# Patient Record
Sex: Female | Born: 1945 | Race: White | Hispanic: No | Smoking: Never smoker
Health system: Southern US, Community
[De-identification: ages and names within clinical notes are randomized; demographics above are authoritative.]

## PROBLEM LIST (undated history)

## (undated) DIAGNOSIS — I10 Essential (primary) hypertension: Secondary | ICD-10-CM

## (undated) DIAGNOSIS — R3915 Urgency of urination: Secondary | ICD-10-CM

## (undated) DIAGNOSIS — M1990 Unspecified osteoarthritis, unspecified site: Secondary | ICD-10-CM

## (undated) DIAGNOSIS — J302 Other seasonal allergic rhinitis: Secondary | ICD-10-CM

## (undated) DIAGNOSIS — R351 Nocturia: Secondary | ICD-10-CM

## (undated) DIAGNOSIS — N3030 Trigonitis without hematuria: Secondary | ICD-10-CM

## (undated) DIAGNOSIS — F419 Anxiety disorder, unspecified: Secondary | ICD-10-CM

## (undated) DIAGNOSIS — N362 Urethral caruncle: Secondary | ICD-10-CM

## (undated) DIAGNOSIS — N952 Postmenopausal atrophic vaginitis: Secondary | ICD-10-CM

## (undated) DIAGNOSIS — E785 Hyperlipidemia, unspecified: Secondary | ICD-10-CM

## (undated) DIAGNOSIS — G932 Benign intracranial hypertension: Secondary | ICD-10-CM

## (undated) DIAGNOSIS — K7460 Unspecified cirrhosis of liver: Secondary | ICD-10-CM

## (undated) DIAGNOSIS — N393 Stress incontinence (female) (male): Secondary | ICD-10-CM

## (undated) DIAGNOSIS — E663 Overweight: Secondary | ICD-10-CM

## (undated) DIAGNOSIS — R0982 Postnasal drip: Secondary | ICD-10-CM

## (undated) DIAGNOSIS — N210 Calculus in bladder: Secondary | ICD-10-CM

## (undated) DIAGNOSIS — N35919 Unspecified urethral stricture, male, unspecified site: Secondary | ICD-10-CM

## (undated) DIAGNOSIS — IMO0002 Reserved for concepts with insufficient information to code with codable children: Secondary | ICD-10-CM

## (undated) DIAGNOSIS — N3020 Other chronic cystitis without hematuria: Secondary | ICD-10-CM

## (undated) DIAGNOSIS — N8110 Cystocele, unspecified: Secondary | ICD-10-CM

## (undated) DIAGNOSIS — K219 Gastro-esophageal reflux disease without esophagitis: Secondary | ICD-10-CM

## (undated) DIAGNOSIS — N889 Noninflammatory disorder of cervix uteri, unspecified: Secondary | ICD-10-CM

## (undated) DIAGNOSIS — K769 Liver disease, unspecified: Secondary | ICD-10-CM

## (undated) DIAGNOSIS — I639 Cerebral infarction, unspecified: Secondary | ICD-10-CM

## (undated) DIAGNOSIS — Z8673 Personal history of transient ischemic attack (TIA), and cerebral infarction without residual deficits: Secondary | ICD-10-CM

## (undated) HISTORY — DX: Personal history of transient ischemic attack (TIA), and cerebral infarction without residual deficits: Z86.73

## (undated) HISTORY — DX: Postmenopausal atrophic vaginitis: N95.2

## (undated) HISTORY — DX: Trigonitis without hematuria: N30.30

## (undated) HISTORY — DX: Urgency of urination: R39.15

## (undated) HISTORY — PX: TONSILLECTOMY: SUR1361

## (undated) HISTORY — DX: Reserved for concepts with insufficient information to code with codable children: IMO0002

## (undated) HISTORY — DX: Essential (primary) hypertension: I10

## (undated) HISTORY — DX: Nocturia: R35.1

## (undated) HISTORY — DX: Overweight: E66.3

## (undated) HISTORY — DX: Liver disease, unspecified: K76.9

## (undated) HISTORY — DX: Hyperlipidemia, unspecified: E78.5

## (undated) HISTORY — DX: Unspecified urethral stricture, male, unspecified site: N35.919

## (undated) HISTORY — DX: Calculus in bladder: N21.0

## (undated) HISTORY — DX: Unspecified osteoarthritis, unspecified site: M19.90

## (undated) HISTORY — DX: Gastro-esophageal reflux disease without esophagitis: K21.9

## (undated) HISTORY — DX: Unspecified cirrhosis of liver: K74.60

## (undated) HISTORY — DX: Other chronic cystitis without hematuria: N30.20

## (undated) HISTORY — DX: Noninflammatory disorder of cervix uteri, unspecified: N88.9

## (undated) HISTORY — DX: Benign intracranial hypertension: G93.2

## (undated) HISTORY — PX: GASTRIC BYPASS: SHX52

## (undated) HISTORY — DX: Urethral caruncle: N36.2

## (undated) HISTORY — DX: Anxiety disorder, unspecified: F41.9

---
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
## 1974-11-25 HISTORY — PX: VAGINAL HYSTERECTOMY: SUR661

## 1997-11-25 HISTORY — PX: BLADDER SURGERY: SHX569

## 2005-03-19 ENCOUNTER — Ambulatory Visit: Payer: Self-pay | Admitting: Family Medicine

## 2005-05-03 ENCOUNTER — Ambulatory Visit: Payer: Self-pay | Admitting: Gastroenterology

## 2005-06-03 ENCOUNTER — Ambulatory Visit: Payer: Self-pay | Admitting: Family Medicine

## 2005-09-26 ENCOUNTER — Ambulatory Visit: Payer: Self-pay | Admitting: Family Medicine

## 2006-03-29 ENCOUNTER — Emergency Department: Payer: Self-pay | Admitting: Emergency Medicine

## 2006-05-07 ENCOUNTER — Ambulatory Visit: Payer: Self-pay | Admitting: Family Medicine

## 2008-09-06 ENCOUNTER — Ambulatory Visit: Payer: Self-pay | Admitting: Unknown Physician Specialty

## 2008-12-22 ENCOUNTER — Ambulatory Visit: Payer: Self-pay | Admitting: Family Medicine

## 2009-10-08 ENCOUNTER — Emergency Department: Payer: Self-pay | Admitting: Emergency Medicine

## 2010-02-13 ENCOUNTER — Ambulatory Visit: Payer: Self-pay | Admitting: Family Medicine

## 2011-02-15 ENCOUNTER — Ambulatory Visit: Payer: Self-pay | Admitting: Gastroenterology

## 2011-06-10 ENCOUNTER — Ambulatory Visit: Payer: Self-pay | Admitting: Family Medicine

## 2012-08-31 ENCOUNTER — Ambulatory Visit: Payer: Self-pay | Admitting: Family Medicine

## 2012-09-07 ENCOUNTER — Emergency Department: Payer: Self-pay | Admitting: Emergency Medicine

## 2012-09-07 ENCOUNTER — Ambulatory Visit: Payer: Self-pay | Admitting: Family Medicine

## 2012-09-07 LAB — CREATININE, SERUM: Creatinine: 1.05 mg/dL (ref 0.60–1.30)

## 2012-10-13 ENCOUNTER — Ambulatory Visit: Payer: Self-pay | Admitting: Family Medicine

## 2013-10-14 ENCOUNTER — Ambulatory Visit: Payer: Self-pay | Admitting: Family Medicine

## 2014-01-07 ENCOUNTER — Ambulatory Visit: Payer: Self-pay | Admitting: Gastroenterology

## 2014-08-31 ENCOUNTER — Ambulatory Visit: Payer: Self-pay | Admitting: Family Medicine

## 2014-12-26 HISTORY — PX: OTHER SURGICAL HISTORY: SHX169

## 2015-01-05 ENCOUNTER — Ambulatory Visit: Payer: Self-pay | Admitting: Orthopedic Surgery

## 2015-01-16 ENCOUNTER — Ambulatory Visit: Payer: Self-pay | Admitting: Anesthesiology

## 2015-01-16 DIAGNOSIS — I10 Essential (primary) hypertension: Secondary | ICD-10-CM

## 2015-01-19 ENCOUNTER — Ambulatory Visit: Payer: Self-pay | Admitting: Orthopedic Surgery

## 2015-03-26 NOTE — Op Note (Signed)
PATIENT NAME:  Abigail Horne, Abigail Horne MR#:  [id number] DATE OF BIRTH:  1946/02/21  DATE OF PROCEDURE:  01/19/2015  PREOPERATIVE DIAGNOSIS: Medial meniscus tear and arthritis, left knee.   POSTOPERATIVE DIAGNOSIS: Medial meniscus tear and arthritis, left knee.  PROCEDURE: Left knee arthroscopy.   ANESTHESIA: General.   SURGEON: Laurene Footman, MD   DESCRIPTION OF PROCEDURE: The patient was brought to the operating room and after adequate anesthesia was obtained, the left leg was prepped and draped in the usual sterile fashion with the leg in the arthroscopic leg holder with a tourniquet applied but not required. After patient identification and timeout procedures were completed, an inferolateral portal was made and the arthroscope was introduced. Initial inspection revealed mild to moderate patellofemoral degenerative change in both trochlea and patella with many small fragments of cartilage floating in the joint. The gutters were checked and there were no loose bodies. Coming around medially, an inferomedial portal was made and, on probing, there was a complex tear of the middle and posterior thirds of the meniscus with a large flap tear that was flipped under the medial middle third. There was also associated 1 x 2 cm area of complete cartilage loss with exposed bone. The remaining in the medial compartment was relatively spared. The ACL was intact and the lateral compartment was normal. A meniscal punch arthroscopic shaver and ArthroCare wand were all used to debride the medial meniscus back to a stable margin, as well as thoroughly irrigating out the knee and removing the loose articular cartilage from the medial femoral defect. After addressing the meniscal pathology and irrigating out the knee, all instrumentation was withdrawn. The wounds were closed in simple interrupted 4-0 nylon; 20 mL of 0.5% Sensorcaine infiltrated for postoperative analgesia. Xeroform, 4 x 4's, Webril, and Ace wrap applied. The  patient was sent to the recovery room in stable condition.   ESTIMATED BLOOD LOSS: Minimal.   COMPLICATIONS: None.   SPECIMEN: None. Pre- and postprocedure pictures obtained.    ____________________________ Laurene Footman, MD mjm:bm D: 01/19/2015 19:48:49 ET T: 01/20/2015 07:08:59 ET JOB#: [id number]  cc: Laurene Footman, MD, <Dictator> Laurene Footman MD ELECTRONICALLY SIGNED 01/20/2015 8:31

## 2015-05-02 ENCOUNTER — Encounter: Payer: Self-pay | Admitting: *Deleted

## 2015-05-02 ENCOUNTER — Encounter: Payer: Self-pay | Admitting: Urology

## 2015-05-03 ENCOUNTER — Telehealth: Payer: Self-pay | Admitting: Urology

## 2015-05-03 ENCOUNTER — Encounter: Payer: Self-pay | Admitting: Urology

## 2015-05-03 ENCOUNTER — Ambulatory Visit (INDEPENDENT_AMBULATORY_CARE_PROVIDER_SITE_OTHER): Payer: PPO | Admitting: Urology

## 2015-05-03 VITALS — BP 168/78 | HR 61 | Ht 63.0 in | Wt 242.7 lb

## 2015-05-03 DIAGNOSIS — R399 Unspecified symptoms and signs involving the genitourinary system: Secondary | ICD-10-CM | POA: Diagnosis not present

## 2015-05-03 DIAGNOSIS — N8110 Cystocele, unspecified: Secondary | ICD-10-CM

## 2015-05-03 DIAGNOSIS — IMO0001 Reserved for inherently not codable concepts without codable children: Secondary | ICD-10-CM

## 2015-05-03 DIAGNOSIS — IMO0002 Reserved for concepts with insufficient information to code with codable children: Secondary | ICD-10-CM

## 2015-05-03 DIAGNOSIS — N952 Postmenopausal atrophic vaginitis: Secondary | ICD-10-CM

## 2015-05-03 LAB — URINALYSIS, COMPLETE
Bilirubin, UA: NEGATIVE
GLUCOSE, UA: NEGATIVE
Ketones, UA: NEGATIVE
Nitrite, UA: POSITIVE — AB
PH UA: 5.5 (ref 5.0–7.5)
PROTEIN UA: NEGATIVE
RBC, UA: NEGATIVE
SPEC GRAV UA: 1.025 (ref 1.005–1.030)
UUROB: 0.2 mg/dL (ref 0.2–1.0)

## 2015-05-03 LAB — MICROSCOPIC EXAMINATION

## 2015-05-03 LAB — BLADDER SCAN AMB NON-IMAGING: Scan Result: 30

## 2015-05-03 NOTE — Telephone Encounter (Signed)
Called in medication to [facility] for 0.075%. Pharmacy will notify pt when medication is ready. Cw,lpn

## 2015-05-03 NOTE — Progress Notes (Signed)
05/03/2015 2:27 PM   [name] 08-21-1946 [id number]  Referring provider: No referring provider defined for this encounter.  Chief Complaint  Patient presents with  . Urinary Tract Infection    burning, itching    HPI: Abigail Horne is a 69 year old white female who initially presented to [place] 3 years ago for recurrent urinary tract infections at the request of her primary care physician. She was found to have atrophic vaginitis and a cystocele.  She was placed on vaginal estrogen cream and her urinary tract infections have reduced in their frequency.  We last saw her in February 2016 for routine office visit. She was scheduled to return to our office in a year, but recently had a urinary tract infection which took  2 courses of Cipro to clear her of her symptoms. Her symptoms at the time her infections were lower leg pain, perineal pain, suprapubic pain and dysuria. She denies any fevers, chills, nausea and vomiting at that time.  She denies any gross hematuria. Today, she has no symptoms.  She wanted to follow up with [place] to make sure everything was okay.  She has not been using her vaginal Estrace cream routinely over the last few months due to her husband being in and out of the hospital and caring for him.  Her PVR today was minimal and her UA had 6-10 WBC's/hpf with many bacteria today.  It was a cath specimen.  She is not experiencing any leakage.   Her two UCx's are listed below:    Urine Culture, Routine - [facility]4/18/2016  [facility]  Component Name Value Range  Urine Culture, Routine - [facility] Final report (A)   Result 1 - [facility] Comment (A) Comment: Enterobacter cloacae complex Greater than 100,000 colony forming units per mL   Antimicrobial Susceptibility - [facility] Comment Comment:       ** S = Susceptible; I = Intermediate; R = Resistant **                    P = Positive; N = Negative             MICS are expressed in micrograms per mL  Antibiotic                 RSLT#1    RSLT#2    RSLT#3    RSLT#4 Amoxicillin/Clavulanic Acid    R Cefazolin                      R Cefepime                       S Ceftriaxone                    S Cefuroxime                     R Cephalothin                    R Ciprofloxacin                  S Ertapenem                      S Gentamicin                     S Imipenem  S Levofloxacin                   S Nitrofurantoin                 I Piperacillin                   S Tetracycline                   S Tobramycin                     S Trimethoprim/Sulfa             S    Specimen  Urine   Result Narrative  Performed at:  [facility] [address], [place], [place]  [id number] Lab Director: Lindon Romp MD, Phone:  [phone]   Urine Culture, Routine - [facility]5/03/2015  [facility]  Component Name Value Range  Urine Culture, Routine - [facility] Final report   Result 1 - [facility] Comment Comment: Culture shows less than 10,000 colony forming units of bacteria per milliliter of urine. This colony count is not generally considered to be clinically significant.    Specimen  Urine   Result Narrative  Performed at:  [address] [place], [place]  [id number] Lab Director: Lindon Romp MD, Phone:  [phone]      PMH: Past Medical History  Diagnosis Date  . Liver disease   . Hx-TIA (transient ischemic attack)   . BIH (benign intracranial hypertension)   . Hyperlipidemia   . Cervix abnormality   . Arthritis   . Hypertension   . Cirrhosis   . Cystocele   . Nocturia   . Over weight   . Atrophic vaginitis   . Urethral caruncle   . Chronic cystitis   . Urinary urgency   . Bladder calculus   . Trigonitis   . Urethral stricture     Surgical History: Past Surgical History  Procedure Laterality Date  . Bladder surgery  1999    tack  . Abdominal hysterectomy  1976  . Appendectomy   1955  . Gastric bypass      lap band  . Arthroscopic knee surgery Left 12/2014    meniscus    Home Medications:    Medication List       This list is accurate as of: 05/03/15 11:59 PM.  Always use your most recent med list.               amLODipine 5 MG tablet  Commonly known as:  NORVASC  Take 5 mg by mouth daily.     estradiol 0.1 MG/GM vaginal cream  Commonly known as:  ESTRACE  Place 1 Applicatorful vaginally at bedtime.     etodolac 500 MG tablet  Commonly known as:  LODINE     lisinopril-hydrochlorothiazide 20-25 MG per tablet  Commonly known as:  PRINZIDE,ZESTORETIC  Take 1 tablet by mouth daily.     metoprolol succinate 50 MG 24 hr tablet  Commonly known as:  TOPROL-XL  Take 50 mg by mouth daily. Take with or immediately following a meal.     naproxen sodium 220 MG tablet  Commonly known as:  ANAPROX  Take 220 mg by mouth as needed.     omeprazole 20 MG capsule  Commonly known as:  PRILOSEC  Take 20 mg by mouth daily.     PRESERVISION/LUTEIN PO  Take by mouth.     sertraline 100 MG tablet  Commonly known as:  ZOLOFT  Take by mouth.     TYLENOL ALLERGY MULTI-SYMPTOM PO  Take by mouth.        Allergies:  Allergies  Allergen Reactions  . Contrast Media [Iodinated Diagnostic Agents] Other (See Comments)    irregular heartbeat.    Family History: Family History  Problem Relation Age of Onset  . Heart attack Father   . Alzheimer's disease Mother   . Kidney disease Neg Hx     Social History:  reports that she has never smoked. She does not have any smokeless tobacco history on file. She reports that she does not drink alcohol or use illicit drugs.  ROS: Urological Symptom Review  Patient is experiencing the following symptoms: Get up at night to urinate Urinary tract infection   Review of Systems  Gastrointestinal (upper)  : Negative for upper GI symptoms  Gastrointestinal (lower) : Negative for lower GI  symptoms  Constitutional : Negative for symptoms  Skin: Negative for skin symptoms  Eyes: Negative for eye symptoms  Ear/Nose/Throat : Negative for Ear/Nose/Throat symptoms  Hematologic/Lymphatic: Negative for Hematologic/Lymphatic symptoms  Cardiovascular : Negative for cardiovascular symptoms  Respiratory : Negative for respiratory symptoms  Endocrine: Negative for endocrine symptoms  Musculoskeletal: Negative for musculoskeletal symptoms  Neurological: Negative for neurological symptoms  Psychologic: Negative for psychiatric symptoms   Physical Exam: BP 168/78 mmHg  Pulse 61  Ht 5\' 3"  (1.6 m)  Wt 242 lb 11.2 oz (110.088 kg)  BMI 43.00 kg/m2  LMP   GU: No tenderness, inflammation, rashes or lesions of external genitalia, urethral meatus with caruncle, no discharge (Urethral hypermobility without demonstrable SUI), urethra normal, bladder non-tender, no palpable masses (Fairly good vaginal vault support, no evidence of apical descent, Grade II cystocele and rectocele with Valsalva), normal vaginal walls with atrophic mucosa, no lesions, normal cervix, no motion tenderness or discharge, adnexa could not be palpated due to body habitus Laboratory Data: No results found for: WBC, HGB, HCT, MCV, PLT  Lab Results  Component Value Date   CREATININE 1.05 09/07/2012    No results found for: PSA  No results found for: TESTOSTERONE  No results found for: HGBA1C  Urinalysis Results for orders placed or performed in visit on 05/03/15  CULTURE, URINE COMPREHENSIVE  Result Value Ref Range   Urine Culture, Comprehensive Final report (A)    Result 1 Klebsiella pneumoniae (A)    ANTIMICROBIAL SUSCEPTIBILITY Comment   Microscopic Examination  Result Value Ref Range   WBC, UA 6-10 (A) 0 -  5 /hpf   RBC, UA 0-2 0 -  2 /hpf   Epithelial Cells (non renal) 0-10 0 - 10 /hpf   Bacteria, UA Many (A) None seen/Few  Urinalysis, Complete  Result Value Ref Range   Specific  Gravity, UA 1.025 1.005 - 1.030   pH, UA 5.5 5.0 - 7.5   Color, UA Yellow Yellow   Appearance Ur Hazy (A) Clear   Leukocytes, UA 1+ (A) Negative   Protein, UA Negative Negative/Trace   Glucose, UA Negative Negative   Ketones, UA Negative Negative   RBC, UA Negative Negative   Bilirubin, UA Negative Negative   Urobilinogen, Ur 0.2 0.2 - 1.0 mg/dL   Nitrite, UA Positive (A) Negative   Microscopic Examination See below:   Bladder Scan (Post Void Residual) in office  Result Value Ref Range   Scan Result 30  Pertinent Imaging: Results for Abigail, Horne (MRN [id number]) as of 05/03/2015 10:19  Ref. Range 05/03/2015 09:26  Scan Result Unknown 30    Procedure:  In and Out Catheterization  Patient is present today for a I & O catheterization due to current UTI's. Patient was cleaned and prepped in a sterile fashion with betadine.  A 16FR cath was inserted no complications were noted , 35 ml of urine return was noted, urine was yellow, clear in color. A clean urine sample was collected for culture. Bladder was drained  And catheter was removed with out difficulty.    Preformed by: Zara Council P.A.-C    Assessment & Plan:    1. UTI symptoms- Patient's symptoms of lower leg pain, perineal pain, suprapubic pain and dysuria have resolved. Her urine culture was positive for Enterobacter which was sensitive to the Cipro she was prescribed. Her symptoms still persisted even though the repeat culture was negative for infection. She did respond to a 2 week course of ciprofloxacin. Today she did have 6-10 WBC's per high-power field and many bacteria in her catheter specimen. We will send that for culture to ensure resolution of the urinary tract infection.  If there is infection, we will treat with appropriate antibiotics. Then return in 3-5 days for a another catheter specimen to ensure resolution of the infection. If culture returns negative for infection, she will follow up in 1 year. She  is encouraged to use the vaginal estrogen cream 3 nights weekly. I also reiterated this to her as an important step in preventing further urinary tract infections.  - Urinalysis, Complete - CULTURE, URINE COMPREHENSIVE - Bladder Scan (Post Void Residual) in office  2. Atrophic vaginitis- Patient is not using her vaginal estrogen cream on a consistent basis due to her husband's illnesses.  I explained to the patient that this is an important step in preventing urinary tract infections and to use the vaginal Estrace cream on a consistent basis 3 nights weekly.  She will return in 1 year for examination.  3. Cystocele- Patient wanted to know what she should do about her cystocele. We discussed how her weight is contributing her pelvic prolapsing. We discussed physical therapy and/or pessary fitting for treatment of her cystocele.  She did not want to pursue either of those options and she is not a surgical candidate at this time due to her high BMI.   She is not experiencing any stress urinary incontinence, so surgery is not indicated at this time.    No Follow-up on file. Zara Council, [facility] [address] [place], [place] [address] [phone]

## 2015-05-03 NOTE — Telephone Encounter (Signed)
Please call in the compounded estrogen cream to [facility].

## 2015-05-05 LAB — CULTURE, URINE COMPREHENSIVE

## 2015-05-06 DIAGNOSIS — R399 Unspecified symptoms and signs involving the genitourinary system: Secondary | ICD-10-CM | POA: Insufficient documentation

## 2015-05-06 DIAGNOSIS — N952 Postmenopausal atrophic vaginitis: Secondary | ICD-10-CM | POA: Insufficient documentation

## 2015-05-06 DIAGNOSIS — N8110 Cystocele, unspecified: Secondary | ICD-10-CM | POA: Insufficient documentation

## 2015-05-08 ENCOUNTER — Telehealth: Payer: Self-pay

## 2015-05-08 DIAGNOSIS — N390 Urinary tract infection, site not specified: Secondary | ICD-10-CM

## 2015-05-08 MED ORDER — AMOXICILLIN-POT CLAVULANATE 875-125 MG PO TABS: 1.0000 | ORAL_TABLET | Freq: Two times a day (BID) | ORAL | Status: DC

## 2015-05-08 NOTE — Telephone Encounter (Signed)
-----   Message from Nori Riis, PA-C sent at 05/05/2015  4:58 PM EDT ----- Patient has a +UCx.  She needs to start Augmentin 875/125 mg one tablet twice daily for seven days.  We then need a cath specimen 3 to 5 days after she finishes her antibiotics.

## 2015-05-09 NOTE — Telephone Encounter (Signed)
Spoke with pt and made aware of +urine cx. Pt made aware of needing cath specimen post abt. Pt voiced understanding. Medication called into pharmacy. Cw,lpn

## 2015-05-17 DIAGNOSIS — K219 Gastro-esophageal reflux disease without esophagitis: Secondary | ICD-10-CM | POA: Insufficient documentation

## 2015-05-17 DIAGNOSIS — I10 Essential (primary) hypertension: Secondary | ICD-10-CM | POA: Insufficient documentation

## 2015-05-17 DIAGNOSIS — F419 Anxiety disorder, unspecified: Secondary | ICD-10-CM | POA: Insufficient documentation

## 2015-05-17 DIAGNOSIS — E668 Other obesity: Secondary | ICD-10-CM | POA: Insufficient documentation

## 2015-06-26 ENCOUNTER — Ambulatory Visit (INDEPENDENT_AMBULATORY_CARE_PROVIDER_SITE_OTHER): Payer: PPO | Admitting: Urology

## 2015-06-26 ENCOUNTER — Encounter: Payer: Self-pay | Admitting: Urology

## 2015-06-26 VITALS — BP 123/75 | HR 56 | Ht 62.0 in | Wt 239.5 lb

## 2015-06-26 DIAGNOSIS — N8111 Cystocele, midline: Secondary | ICD-10-CM

## 2015-06-26 DIAGNOSIS — N390 Urinary tract infection, site not specified: Secondary | ICD-10-CM | POA: Insufficient documentation

## 2015-06-26 LAB — URINALYSIS, COMPLETE
Bilirubin, UA: NEGATIVE
Glucose, UA: NEGATIVE
Ketones, UA: NEGATIVE
Nitrite, UA: POSITIVE — AB
PH UA: 6.5 (ref 5.0–7.5)
PROTEIN UA: NEGATIVE
SPEC GRAV UA: 1.02 (ref 1.005–1.030)
Urobilinogen, Ur: 1 mg/dL (ref 0.2–1.0)

## 2015-06-26 LAB — MICROSCOPIC EXAMINATION
Epithelial Cells (non renal): NONE SEEN /hpf (ref 0–10)
RBC, UA: NONE SEEN /hpf (ref 0–?)

## 2015-06-26 NOTE — Progress Notes (Signed)
In and Out Catheterization  Patient is present today for a I & O catheterization due to recurrent UTI. Patient was cleaned and prepped in a sterile fashion with betadine and Lidocaine 2% jelly was instilled into the urethra.  A 14 FR cath was inserted no complications were noted , 80 ml of urine return was noted, urine was light yellow  in color. A clean urine sample was collected for recurrent UTI.  Bladder was drained and catheter was removed with out difficulty.    Preformed by: K.Russell, CMA  Follow up/ Additional notes: Urine was sent for culture

## 2015-06-26 NOTE — Progress Notes (Addendum)
06/26/2015 3:40 PM   Jeronimo Greaves [place] 24-May-1946 [id number]  Referring provider: [place] Pink, MD [address] [place], [place] [address]  Chief Complaint  Patient presents with  . Follow-up    recheck uti    HPI: Mrs. Plantz is a 69 year old white female with a history of positive urine culture with Klebsiella pneumonia 8. He was sensitive to Augmentin. She did take a 7 day course of this medication, but she failed to follow-up for a catheter specimen 3-5 days after completing her antibiotic due to her husband passing away.  Today, she complains of malodorous urine. She states it has a strong sulfur smell to it. She is also experiencing an increase in urinary frequency.     She is denied any fevers, chills, nausea or vomiting. She also denies any gross hematuria.   Her cath urinalysis today is nitrite positive with 11-30 WBC's and many bacteria per high-power field  PMH: Past Medical History  Diagnosis Date  . Liver disease   . Hx-TIA (transient ischemic attack)   . BIH (benign intracranial hypertension)   . Hyperlipidemia   . Cervix abnormality   . Arthritis   . Hypertension   . Cirrhosis   . Cystocele   . Nocturia   . Over weight   . Atrophic vaginitis   . Urethral caruncle   . Chronic cystitis   . Urinary urgency   . Bladder calculus   . Trigonitis   . Urethral stricture     Surgical History: Past Surgical History  Procedure Laterality Date  . Bladder surgery  1999    tack  . Abdominal hysterectomy  1976  . Appendectomy  1955  . Gastric bypass      lap band  . Arthroscopic knee surgery Left 12/2014    meniscus    Home Medications:    Medication List       This list is accurate as of: 06/26/15  3:40 PM.  Always use your most recent med list.               ALPRAZolam 0.25 MG tablet  Commonly known as:  XANAX  TK 1 T PO QHS PRF SLEEP     amLODipine 5 MG tablet  Commonly known as:  NORVASC  Take 5 mg by mouth daily.     amoxicillin-clavulanate 875-125 MG per tablet  Commonly known as:  AUGMENTIN  Take 1 tablet by mouth 2 (two) times daily.     estradiol 0.1 MG/GM vaginal cream  Commonly known as:  ESTRACE  Place 1 Applicatorful vaginally at bedtime.     etodolac 500 MG tablet  Commonly known as:  LODINE     lisinopril-hydrochlorothiazide 20-25 MG per tablet  Commonly known as:  PRINZIDE,ZESTORETIC  Take 1 tablet by mouth daily.     metoprolol succinate 50 MG 24 hr tablet  Commonly known as:  TOPROL-XL  Take 50 mg by mouth daily. Take with or immediately following a meal.     naproxen sodium 220 MG tablet  Commonly known as:  ANAPROX  Take 220 mg by mouth as needed.     omeprazole 20 MG capsule  Commonly known as:  PRILOSEC  Take 20 mg by mouth daily.     PRESERVISION/LUTEIN PO  Take by mouth.     sertraline 100 MG tablet  Commonly known as:  ZOLOFT  Take by mouth.     TYLENOL ALLERGY MULTI-SYMPTOM PO  Take by mouth.  Allergies:  Allergies  Allergen Reactions  . Contrast Media [Iodinated Diagnostic Agents] Other (See Comments)    irregular heartbeat.    Family History: Family History  Problem Relation Age of Onset  . Heart attack Father   . Alzheimer's disease Mother   . Kidney disease Neg Hx   . Bladder Cancer Neg Hx     Social History:  reports that she has never smoked. She does not have any smokeless tobacco history on file. She reports that she does not drink alcohol or use illicit drugs.  ROS: UROLOGY Frequent Urination?: No Hard to postpone urination?: No Burning/pain with urination?: No Get up at night to urinate?: Yes Leakage of urine?: Yes Urine stream starts and stops?: No Trouble starting stream?: No Do you have to strain to urinate?: No Blood in urine?: No Urinary tract infection?: Yes Sexually transmitted disease?: No Injury to kidneys or bladder?: No Painful intercourse?: No Weak stream?: No Currently pregnant?: No Vaginal bleeding?:  No Last menstrual period?: n  Gastrointestinal Nausea?: No Vomiting?: No Indigestion/heartburn?: No Diarrhea?: No Constipation?: No  Constitutional Fever: No Night sweats?: No Weight loss?: No Fatigue?: No  Skin Skin rash/lesions?: No Itching?: No  Eyes Blurred vision?: No Double vision?: No  Ears/Nose/Throat Sore throat?: No Sinus problems?: Yes  Hematologic/Lymphatic Swollen glands?: No Easy bruising?: No  Cardiovascular Leg swelling?: No Chest pain?: No  Respiratory Cough?: No Shortness of breath?: No  Endocrine Excessive thirst?: No  Musculoskeletal Back pain?: No Joint pain?: No  Neurological Headaches?: No Dizziness?: No  Psychologic Depression?: Yes Anxiety?: No  Physical Exam: BP 123/75 mmHg  Pulse 56  Ht 5\' 2"  (1.575 m)  Wt 239 lb 8 oz (108.636 kg)  BMI 43.79 kg/m2  GU:  Normal external genitalia.  Normal urethral meatus. No urethral masses and/or tenderness. No bladder fullness or masses. Grade II cystocele is noted.  No vaginal lesions or discharge. Normal rectal tone, no masses. Normal anus and perineum.   Laboratory Data: Cath UA Results for orders placed or performed in visit on 06/26/15  Microscopic Examination  Result Value Ref Range   WBC, UA 11-30 (A) 0 -  5 /hpf   RBC, UA None seen 0 -  2 /hpf   Epithelial Cells (non renal) None seen 0 - 10 /hpf   Bacteria, UA Many (A) None seen/Few  Urinalysis, Complete  Result Value Ref Range   Specific Gravity, UA 1.020 1.005 - 1.030   pH, UA 6.5 5.0 - 7.5   Color, UA Yellow Yellow   Appearance Ur Cloudy (A) Clear   Leukocytes, UA 2+ (A) Negative   Protein, UA Negative Negative/Trace   Glucose, UA Negative Negative   Ketones, UA Negative Negative   RBC, UA Trace (A) Negative   Bilirubin, UA Negative Negative   Urobilinogen, Ur 1.0 0.2 - 1.0 mg/dL   Nitrite, UA Positive (A) Negative   Microscopic Examination See below:     No results found for: WBC, HGB, HCT, MCV, PLT  Lab  Results  Component Value Date   CREATININE 1.05 09/07/2012    No results found for: PSA  No results found for: TESTOSTERONE  No results found for: HGBA1C  Urinalysis    Component Value Date/Time   GLUCOSEU Negative 06/26/2015 1035   BILIRUBINUR Negative 06/26/2015 1035   NITRITE Positive* 06/26/2015 1035   LEUKOCYTESUR 2+* 06/26/2015 1035    Pertinent Imaging:   Assessment & Plan:    1. Recurrent UTI:   Patient  was treated  for Klebsiella urinary tract infection in June with Augmentin. She did not return for follow-up catheter UA due to the death in her family.  She is experiencing a malodorous urine and an increase in urinary frequency. Her cath UA is suspicious for infection. I will send it for culture. Patient will wait until sensitivities are available to start an antibiotic.  - Urinalysis, Complete  2. Cystocele:  I discussed with the patient how the cystocele may be contributing to her urinary tract infections by not allowing her to empty her bladder completely.  Patient would now like a referral for a pessary fitting.   I will refer her to [facility] for the fitting.       No Follow-up on file.  Zara Council, [facility] [address], [place], [place] [address] [phone]   n

## 2015-06-28 ENCOUNTER — Telehealth: Payer: Self-pay | Admitting: *Deleted

## 2015-06-28 ENCOUNTER — Other Ambulatory Visit: Payer: Self-pay | Admitting: *Deleted

## 2015-06-28 LAB — CULTURE, URINE COMPREHENSIVE

## 2015-06-28 MED ORDER — SULFAMETHOXAZOLE-TRIMETHOPRIM 800-160 MG PO TABS: 1.0000 | ORAL_TABLET | Freq: Two times a day (BID) | ORAL | Status: DC

## 2015-06-28 NOTE — Telephone Encounter (Signed)
I spoke w/the patient and relayed her lab results as well Shannon's instructions.  I informed the pt that the antibiotic Rx would be sent to her pharmacy.  The pt indicated understanding and had no questions.

## 2015-06-28 NOTE — Telephone Encounter (Signed)
-----   Message from Nori Riis, PA-C sent at 06/28/2015 12:04 PM EDT ----- Patient has a +UCx.  They need to start Septra DS one  twice daily for seven days and then we need to check a CATH specimen in 3 to 5 days after they complete their antibiotics.

## 2015-06-29 ENCOUNTER — Encounter: Payer: Self-pay | Admitting: *Deleted

## 2015-06-29 ENCOUNTER — Telehealth: Payer: Self-pay | Admitting: *Deleted

## 2015-06-29 NOTE — Telephone Encounter (Signed)
Opened in error checking "Order" completion. . . sm

## 2015-07-21 ENCOUNTER — Ambulatory Visit: Payer: PPO | Admitting: Urology

## 2015-07-21 ENCOUNTER — Encounter: Payer: Self-pay | Admitting: Urology

## 2015-07-21 ENCOUNTER — Ambulatory Visit (INDEPENDENT_AMBULATORY_CARE_PROVIDER_SITE_OTHER): Payer: PPO | Admitting: Urology

## 2015-07-21 VITALS — BP 146/76 | HR 68 | Ht 62.0 in | Wt 245.8 lb

## 2015-07-21 DIAGNOSIS — N952 Postmenopausal atrophic vaginitis: Secondary | ICD-10-CM

## 2015-07-21 DIAGNOSIS — IMO0002 Reserved for concepts with insufficient information to code with codable children: Secondary | ICD-10-CM

## 2015-07-21 DIAGNOSIS — N390 Urinary tract infection, site not specified: Secondary | ICD-10-CM

## 2015-07-21 DIAGNOSIS — N8110 Cystocele, unspecified: Secondary | ICD-10-CM | POA: Diagnosis not present

## 2015-07-21 DIAGNOSIS — IMO0001 Reserved for inherently not codable concepts without codable children: Secondary | ICD-10-CM

## 2015-07-21 LAB — URINALYSIS, COMPLETE
BILIRUBIN UA: NEGATIVE
Glucose, UA: NEGATIVE
KETONES UA: NEGATIVE
Leukocytes, UA: NEGATIVE
NITRITE UA: NEGATIVE
Protein, UA: NEGATIVE
RBC UA: NEGATIVE
SPEC GRAV UA: 1.02 (ref 1.005–1.030)
Urobilinogen, Ur: 1 mg/dL (ref 0.2–1.0)
pH, UA: 6.5 (ref 5.0–7.5)

## 2015-07-21 LAB — MICROSCOPIC EXAMINATION
Bacteria, UA: NONE SEEN
RBC MICROSCOPIC, UA: NONE SEEN /HPF (ref 0–?)

## 2015-07-21 NOTE — Progress Notes (Signed)
In and Out Catheterization  Patient is present today for a I & O catheterization due to recheck after completion of antibiotic, recurrent uti's. Patient was cleaned and prepped in a sterile fashion with betadine a 14FR cath was inserted with jelly on tip no complications were noted , 150ml of urine return was noted, urine was light yellow and clear in color. A clean urine sample was collected for urine with micro to recheck after course of antibiotic for infection. Bladder was drained  and catheter was removed with out difficulty.    Preformed by: Lyndee Hensen CMA

## 2015-07-29 NOTE — Progress Notes (Signed)
12:26 PM   Abigail Horne [birth year] [id number]  Referring provider: Maryland Pink, MD [address] [place], [place] [address]  Chief Complaint  Patient presents with  . Follow-up    from uti to recheck urine    HPI: Patient is a 69 year old white female with a history of recurrent urinary tract infections who is most recent infection was on 06/26/2015. Her urine was positive for Enterobacter and it had a multi-resistance pattern, but it was sensitive to Septra DS. She was started on that medication and she presents today for further follow-up.  Today, she states the urinary frequency has diminished and her urine no longer has a strong sulfur smell.   She is using her vaginal estrogen cream as prescribed, 3 nights weekly.   She has not had any gross hematuria, suprapubic pain or dysuria. She also denies any recent sent fevers, chills, nausea or vomiting.  Her urinalysis today is negative for infection.  She is still experiencing nocturia and stress urinary incontinence. She has an upcoming appointment with gynecology for a pessary fitting.  PMH: Past Medical History  Diagnosis Date  . Liver disease   . Hx-TIA (transient ischemic attack)   . BIH (benign intracranial hypertension)   . Hyperlipidemia   . Cervix abnormality   . Arthritis   . Hypertension   . Cirrhosis   . Cystocele   . Nocturia   . Over weight   . Atrophic vaginitis   . Urethral caruncle   . Chronic cystitis   . Urinary urgency   . Bladder calculus   . Trigonitis   . Urethral stricture     Surgical History: Past Surgical History  Procedure Laterality Date  . Bladder surgery  1999    tack  . Abdominal hysterectomy  1976  . Appendectomy  1955  . Gastric bypass      lap band  . Arthroscopic knee surgery Left 12/2014    meniscus    Home Medications:    Medication List       This list is accurate as of: 07/21/15 11:59 PM.  Always use your most recent med list.               ALPRAZolam  0.25 MG tablet  Commonly known as:  XANAX  TK 1 T PO QHS PRF SLEEP     amoxicillin-clavulanate 875-125 MG per tablet  Commonly known as:  AUGMENTIN  Take 1 tablet by mouth 2 (two) times daily.     estradiol 0.1 MG/GM vaginal cream  Commonly known as:  ESTRACE  Place 1 Applicatorful vaginally at bedtime.     lisinopril-hydrochlorothiazide 20-25 MG per tablet  Commonly known as:  PRINZIDE,ZESTORETIC  Take 1 tablet by mouth daily.     metoprolol succinate 50 MG 24 hr tablet  Commonly known as:  TOPROL-XL  Take 50 mg by mouth daily. Take with or immediately following a meal.     naproxen sodium 220 MG tablet  Commonly known as:  ANAPROX  Take 220 mg by mouth as needed.     omeprazole 20 MG capsule  Commonly known as:  PRILOSEC  Take 20 mg by mouth daily.     PRESERVISION/LUTEIN PO  Take by mouth.     sertraline 100 MG tablet  Commonly known as:  ZOLOFT  Take by mouth.     sulfamethoxazole-trimethoprim 800-160 MG per tablet  Commonly known as:  BACTRIM DS,SEPTRA DS  Take 1 tablet by mouth 2 (two) times  daily.        Allergies:  Allergies  Allergen Reactions  . Contrast Media [Iodinated Diagnostic Agents] Other (See Comments)    irregular heartbeat.    Family History: Family History  Problem Relation Age of Onset  . Heart attack Father   . Alzheimer's disease Mother   . Kidney disease Neg Hx   . Bladder Cancer Neg Hx     Social History:  reports that she has never smoked. She does not have any smokeless tobacco history on file. She reports that she does not drink alcohol or use illicit drugs.  ROS: UROLOGY Frequent Urination?: No Hard to postpone urination?: No Burning/pain with urination?: No Get up at night to urinate?: Yes Leakage of urine?: Yes Urine stream starts and stops?: No Trouble starting stream?: No Do you have to strain to urinate?: No Blood in urine?: No Urinary tract infection?: Yes Sexually transmitted disease?: No Injury to kidneys  or bladder?: No Painful intercourse?: No Weak stream?: No Currently pregnant?: No Vaginal bleeding?: No Last menstrual period?: n  Gastrointestinal Nausea?: No Vomiting?: No Indigestion/heartburn?: No Diarrhea?: No Constipation?: Yes  Constitutional Fever: No Night sweats?: No Weight loss?: No Fatigue?: No  Skin Skin rash/lesions?: No Itching?: No  Eyes Blurred vision?: No Double vision?: No  Ears/Nose/Throat Sore throat?: No Sinus problems?: No  Hematologic/Lymphatic Swollen glands?: No Easy bruising?: No  Cardiovascular Leg swelling?: Yes Chest pain?: No  Respiratory Cough?: No Shortness of breath?: No  Endocrine Excessive thirst?: No  Musculoskeletal Back pain?: No Joint pain?: No  Neurological Headaches?: Yes Dizziness?: No  Psychologic Depression?: No Anxiety?: Yes  Physical Exam: Blood pressure 146/76, pulse 68, height 5\' 2"  (1.575 m), weight 245 lb 12.8 oz (111.494 kg). GU:  Atrophic external genitalia.  Normal urethral meatus. No urethral masses and/or tenderness. No bladder fullness or masses. Grade II cystocele is noted.  No vaginal lesions or discharge. Normal rectal tone, no masses. Normal anus and perineum.   Laboratory Data: Cath UA Results for orders placed or performed in visit on 07/21/15  Microscopic Examination  Result Value Ref Range   WBC, UA 0-5 0 -  5 /hpf   RBC, UA None seen 0 -  2 /hpf   Epithelial Cells (non renal) 0-10 0 - 10 /hpf   Bacteria, UA None seen None seen/Few  Urinalysis, Complete  Result Value Ref Range   Specific Gravity, UA 1.020 1.005 - 1.030   pH, UA 6.5 5.0 - 7.5   Color, UA Yellow Yellow   Appearance Ur Clear Clear   Leukocytes, UA Negative Negative   Protein, UA Negative Negative/Trace   Glucose, UA Negative Negative   Ketones, UA Negative Negative   RBC, UA Negative Negative   Bilirubin, UA Negative Negative   Urobilinogen, Ur 1.0 0.2 - 1.0 mg/dL   Nitrite, UA Negative Negative    Microscopic Examination See below:    Lab Results  Component Value Date   CREATININE 1.05 09/07/2012    Assessment & Plan:    1. Recurrent UTI:  Patient with a history of recurrent urinary tract infections. She had a positive urine culture for Klebsiella in June of this year and most recently Enterobacter the first week of August of this year. She does have a history of atrophic vaginitis and is using the vaginal estrogen cream 3 times weekly. I believe this will help prevent further urinary tract infections.  Patient will contact our office if she should have any further symptomatology of urinary tract  infection. We will then obtain a catheterized specimen for analysis and culture.  - Urinalysis, Complete  2. Cystocele:   Patient is experiencing stress urinary incontinence and incomplete bladder emptying. She does have an upcoming appointment with gynecology for a pessary fitting.  3. Atrophic vaginitis:   Patient will continue to use a vaginal estrogen cream 3 nights weekly. She will follow-up in one year for symptom recheck and vaginal exam.  Zara Council, [facility]  [facility] [address] [place], [place] [address] [phone]

## 2015-08-10 ENCOUNTER — Other Ambulatory Visit: Payer: Self-pay | Admitting: Family Medicine

## 2015-08-10 DIAGNOSIS — Z1231 Encounter for screening mammogram for malignant neoplasm of breast: Secondary | ICD-10-CM

## 2015-08-16 ENCOUNTER — Encounter: Payer: Self-pay | Admitting: Obstetrics and Gynecology

## 2015-08-16 ENCOUNTER — Ambulatory Visit (INDEPENDENT_AMBULATORY_CARE_PROVIDER_SITE_OTHER): Payer: PPO | Admitting: Obstetrics and Gynecology

## 2015-08-16 VITALS — BP 145/80 | HR 75 | Ht 62.0 in | Wt 256.4 lb

## 2015-08-16 DIAGNOSIS — IMO0002 Reserved for concepts with insufficient information to code with codable children: Secondary | ICD-10-CM

## 2015-08-16 DIAGNOSIS — N393 Stress incontinence (female) (male): Secondary | ICD-10-CM

## 2015-08-16 DIAGNOSIS — IMO0001 Reserved for inherently not codable concepts without codable children: Secondary | ICD-10-CM

## 2015-08-16 DIAGNOSIS — N390 Urinary tract infection, site not specified: Secondary | ICD-10-CM | POA: Diagnosis not present

## 2015-08-16 DIAGNOSIS — N8110 Cystocele, unspecified: Secondary | ICD-10-CM

## 2015-08-16 NOTE — Progress Notes (Signed)
Subjective:     Abigail Horne is a 69 y.o. [phone] female who was referred by [facility] for evaluation of urinary incontinence, recurrent UTI, cystocele and pessary placement. This has been present for 3 years. She leaks urine with coughing, sneezing, with a full bladder.  Patient describes the symptoms as frequent urination (severalx per day), sensation of incomplete emptying of bladder and small leakage of urine if she does not make it to the restroom in time. . Factors associated with symptoms include: none known. Evaluation to date includes UA/CS: abnormal: multiple UTI's.  Notes ~ 6-8 per year.  Last treated for UTI 1 month ago.  Treatment to date includes bladder tack ~ 15 years ago, antibiotics, estrogen vaginal cream .  Past Medical History  Diagnosis Date  . Liver disease   . Hx-TIA (transient ischemic attack)   . BIH (benign intracranial hypertension)   . Hyperlipidemia   . Cervix abnormality   . Arthritis   . Hypertension   . Cirrhosis   . Cystocele   . Nocturia   . Over weight   . Atrophic vaginitis   . Urethral caruncle   . Chronic cystitis   . Urinary urgency   . Bladder calculus   . Trigonitis   . Urethral stricture   . Anxiety   . GERD (gastroesophageal reflux disease)     Past Surgical History  Procedure Laterality Date  . Bladder surgery  1999    tack  . Appendectomy  1955  . Gastric bypass      lap band  . Arthroscopic knee surgery Left 12/2014    meniscus  . Vaginal hysterectomy  1976    Social History  Substance Use Topics  . Smoking status: Never Smoker   . Smokeless tobacco: None  . Alcohol Use: No    Current Outpatient Prescriptions on File Prior to Visit  Medication Sig Dispense Refill  . ALPRAZolam (XANAX) 0.25 MG tablet TK 1 T PO QHS PRF SLEEP  0  . estradiol (ESTRACE) 0.1 MG/GM vaginal cream Place 1 Applicatorful vaginally at bedtime.    Marland Kitchen lisinopril-hydrochlorothiazide (PRINZIDE,ZESTORETIC) 20-25 MG per tablet Take 1 tablet  by mouth daily.    . metoprolol succinate (TOPROL-XL) 50 MG 24 hr tablet Take 50 mg by mouth daily. Take with or immediately following a meal.    . Multiple Vitamins-Minerals (PRESERVISION/LUTEIN PO) Take by mouth.    . naproxen sodium (ANAPROX) 220 MG tablet Take 220 mg by mouth as needed.    Marland Kitchen omeprazole (PRILOSEC) 20 MG capsule Take 20 mg by mouth daily.    . sertraline (ZOLOFT) 100 MG tablet Take by mouth.     No current facility-administered medications on file prior to visit.    Allergies  Allergen Reactions  . Contrast Media [Iodinated Diagnostic Agents] Other (See Comments)    irregular heartbeat.     Review of Systems Pertinent items are noted in HPI.    Objective:    BP 145/80 mmHg  Pulse 75  Ht 5\' 2"  (1.575 m)  Wt 256 lb 6.4 oz (116.302 kg)  BMI 46.88 kg/m2 General appearance: alert and no distress Abdomen: soft, non-tender; bowel sounds normal; no masses,  no organomegaly Pelvic: external genitalia normal, mild atrophy. Vaginal mucosa with moderate atrophy, scant white thin discharge.  Moderate cystocele (Grade 2) present.  1st degree rectocele.  Uterus and cervix surgically absent.  Adnexae non-palpable, nontender.  Extremities: extremities normal, atraumatic, no cyanosis.  Non-pitting edema present.   Lab Review  Urinalysis Office Visit on 07/21/2015  Component Date Value Ref Range Status  . Specific Gravity, UA 07/21/2015 1.020  1.005 - 1.030 Final  . pH, UA 07/21/2015 6.5  5.0 - 7.5 Final  . Color, UA 07/21/2015 Yellow  Yellow Final  . Appearance Ur 07/21/2015 Clear  Clear Final  . Leukocytes, UA 07/21/2015 Negative  Negative Final  . Protein, UA 07/21/2015 Negative  Negative/Trace Final  . Glucose, UA 07/21/2015 Negative  Negative Final  . Ketones, UA 07/21/2015 Negative  Negative Final  . RBC, UA 07/21/2015 Negative  Negative Final  . Bilirubin, UA 07/21/2015 Negative  Negative Final  . Urobilinogen, Ur 07/21/2015 1.0  0.2 - 1.0 mg/dL Final  . Nitrite,  UA 07/21/2015 Negative  Negative Final  . Microscopic Examination 07/21/2015 See below:   Final  . WBC, UA 07/21/2015 0-5  0 -  5 /hpf Final  . RBC, UA 07/21/2015 None seen  0 -  2 /hpf Final  . Epithelial Cells (non renal) 07/21/2015 0-10  0 - 10 /hpf Final  . Bacteria, UA 07/21/2015 None seen  None seen/Few Final   07/21/15: Urine culture negative.   Assessment:   Incontinence.  Severity = mild,  Cystocele Vaginal atrophy  Plan:    Discussed planned voiding. Pessary fitting performed today.  Fitted for size 1 ring with support.   Discussed lifestyle modifications.  To continue vaginal estrogen therapy for vaginal atrophy.  Currently using 3x weekly.  RTC in 3 weeks for pessary insertion.    Rubie Maid, MD [facility]

## 2015-08-16 NOTE — Progress Notes (Deleted)
Subjective:     Abigail Horne is a 69 y.o. [phone] female who was referred by [facility] for evaluation of urinary incontinence, recurrent UTI, and cystocele. This has been present for 3 years. She leaks urine with coughing, sneezing, with a full bladder.  Patient describes the symptoms as frequent urination (severalx per day), sensation of incomplete emptying of bladder and small leakage of urine if she does not make it to the restroom in time. . Factors associated with symptoms include: none known. Evaluation to date includes UA/CS: abnormal: multiple UTI's.  Notes ~ 6-8 per year.  Last treated for UTI 1 month ago.  Treatment to date includes bladder tack ~ 15 years ago, antibiotics, estrogen vaginal cream .  Past Medical History  Diagnosis Date  . Liver disease   . Hx-TIA (transient ischemic attack)   . BIH (benign intracranial hypertension)   . Hyperlipidemia   . Cervix abnormality   . Arthritis   . Hypertension   . Cirrhosis   . Cystocele   . Nocturia   . Over weight   . Atrophic vaginitis   . Urethral caruncle   . Chronic cystitis   . Urinary urgency   . Bladder calculus   . Trigonitis   . Urethral stricture   . Anxiety   . GERD (gastroesophageal reflux disease)     Past Surgical History  Procedure Laterality Date  . Bladder surgery  1999    tack  . Appendectomy  1955  . Gastric bypass      lap band  . Arthroscopic knee surgery Left 12/2014    meniscus  . Vaginal hysterectomy  1976    Social History  Substance Use Topics  . Smoking status: Never Smoker   . Smokeless tobacco: None  . Alcohol Use: No    Current Outpatient Prescriptions on File Prior to Visit  Medication Sig Dispense Refill  . ALPRAZolam (XANAX) 0.25 MG tablet TK 1 T PO QHS PRF SLEEP  0  . estradiol (ESTRACE) 0.1 MG/GM vaginal cream Place 1 Applicatorful vaginally at bedtime.    Marland Kitchen lisinopril-hydrochlorothiazide (PRINZIDE,ZESTORETIC) 20-25 MG per tablet Take 1 tablet by mouth daily.     . metoprolol succinate (TOPROL-XL) 50 MG 24 hr tablet Take 50 mg by mouth daily. Take with or immediately following a meal.    . Multiple Vitamins-Minerals (PRESERVISION/LUTEIN PO) Take by mouth.    . naproxen sodium (ANAPROX) 220 MG tablet Take 220 mg by mouth as needed.    Marland Kitchen omeprazole (PRILOSEC) 20 MG capsule Take 20 mg by mouth daily.    . sertraline (ZOLOFT) 100 MG tablet Take by mouth.     No current facility-administered medications on file prior to visit.    Allergies  Allergen Reactions  . Contrast Media [Iodinated Diagnostic Agents] Other (See Comments)    irregular heartbeat.     Review of Systems Pertinent items are noted in HPI.    Objective:    {Exam:17964}   Lab Review Urinalysis   Assessment:    {Dx:14036}   Plan:    {Plan:14037::"The causes of incontinence and plan for evaluation and treatment were discussed.","Appropriate educational materials were distributed."}    GYNECOLOGY PROGRESS NOTE  Subjective:    Patient ID: Abigail Horne, female    DOB: 07-01-1946, 69 y.o.   MRN: [id number]  HPI  Patient is a 69 y.o. G10P2002 female who presents for   {Common ambulatory SmartLinks:19316}  Review of Systems {ros; complete:30496}   Objective:   Blood  pressure 145/80, pulse 75, height 5\' 2"  (1.575 m), weight 256 lb 6.4 oz (116.302 kg). General appearance: {general exam:16600} Abdomen: {abdominal exam:16834} Pelvic: {pelvic exam:16852::"cervix normal in appearance","external genitalia normal","no adnexal masses or tenderness","no cervical motion tenderness","rectovaginal septum normal","uterus normal size, shape, and consistency","vagina normal without discharge"} Extremities: {extremity exam:5109} Neurologic: {neuro exam:17854}   Assessment:    Plan:    Size 1 ring pessary with support ordered.

## 2015-08-22 ENCOUNTER — Ambulatory Visit
Admission: RE | Admit: 2015-08-22 | Discharge: 2015-08-22 | Disposition: A | Discharge status: Home / Self Care | Payer: PPO | Source: Ambulatory Visit | Attending: Family Medicine | Admitting: Family Medicine

## 2015-08-22 DIAGNOSIS — Z1231 Encounter for screening mammogram for malignant neoplasm of breast: Secondary | ICD-10-CM | POA: Insufficient documentation

## 2015-09-05 ENCOUNTER — Ambulatory Visit (INDEPENDENT_AMBULATORY_CARE_PROVIDER_SITE_OTHER): Payer: PPO | Admitting: Obstetrics and Gynecology

## 2015-09-05 ENCOUNTER — Encounter: Payer: Self-pay | Admitting: Obstetrics and Gynecology

## 2015-09-05 VITALS — BP 176/80 | HR 60 | Resp 14 | Ht 62.0 in | Wt 236.9 lb

## 2015-09-05 DIAGNOSIS — N3946 Mixed incontinence: Secondary | ICD-10-CM | POA: Insufficient documentation

## 2015-09-05 DIAGNOSIS — N8110 Cystocele, unspecified: Secondary | ICD-10-CM

## 2015-09-05 DIAGNOSIS — N390 Urinary tract infection, site not specified: Secondary | ICD-10-CM

## 2015-09-05 DIAGNOSIS — IMO0002 Reserved for concepts with insufficient information to code with codable children: Secondary | ICD-10-CM

## 2015-09-05 DIAGNOSIS — Z4689 Encounter for fitting and adjustment of other specified devices: Secondary | ICD-10-CM

## 2015-09-05 DIAGNOSIS — IMO0001 Reserved for inherently not codable concepts without codable children: Secondary | ICD-10-CM

## 2015-09-05 DIAGNOSIS — N393 Stress incontinence (female) (male): Secondary | ICD-10-CM

## 2015-09-05 DIAGNOSIS — N952 Postmenopausal atrophic vaginitis: Secondary | ICD-10-CM

## 2015-09-05 NOTE — Progress Notes (Addendum)
GYNECOLOGY PROGRESS NOTE  Subjective:    Patient ID: Abigail Horne, female    DOB: 1946-08-22, 69 y.o.   MRN: [id number]  HPI  Patient is a 69 y.o. G61P2002 female who presents for pessary insertion for cystocele, recurrent UTI's, and stress urinary incontinence.   The following portions of the patient's history were reviewed and updated as appropriate: allergies, current medications, past family history, past medical history, past social history, past surgical history and problem list.  Review of Systems Pertinent items noted in HPI and remainder of comprehensive ROS otherwise negative.   Objective:   Blood pressure 176/80, pulse 60, resp. rate 14, height 5\' 2"  (1.575 m), weight 236 lb 14.4 oz (107.457 kg). General appearance: alert and no distress Pelvic: external genitalia without lesions, normal.  Internal exam deferred.   Assessment:    Pessary insertion Cystocele Recurrent UTIs Stress incontinence Vaginal atrophy  Plan:   Fitted with size 1 ring with support, without difficulty.  Continue to encourage lifestyle modifications, planned voiding.  Continue use of estrogen cream for vaginal atrophy.  RTC in 2 weeks for f/u of pessary.    Rubie Maid, MD [facility]

## 2015-09-21 ENCOUNTER — Encounter: Payer: Self-pay | Admitting: Obstetrics and Gynecology

## 2015-09-21 ENCOUNTER — Ambulatory Visit (INDEPENDENT_AMBULATORY_CARE_PROVIDER_SITE_OTHER): Payer: PPO | Admitting: Obstetrics and Gynecology

## 2015-09-21 VITALS — BP 147/74 | HR 65 | Resp 16 | Ht 62.0 in | Wt 249.0 lb

## 2015-09-21 DIAGNOSIS — R351 Nocturia: Secondary | ICD-10-CM

## 2015-09-21 DIAGNOSIS — N393 Stress incontinence (female) (male): Secondary | ICD-10-CM

## 2015-09-21 DIAGNOSIS — Z4689 Encounter for fitting and adjustment of other specified devices: Secondary | ICD-10-CM | POA: Diagnosis not present

## 2015-09-21 NOTE — Progress Notes (Signed)
GYNECOLOGY PROGRESS NOTE  Subjective:    Patient ID: Abigail Horne, female    DOB: 07-05-1946, 69 y.o.   MRN: [id number]  HPI  Patient is a 69 y.o. G36P2002 female who presents for pessary check.   She reports no vaginal bleeding or discharge. She denies pelvic discomfort and difficulty urinating or moving her bowels.  Does report slight increase in stress incontinence at night, and still has nocturia 2-3 times per night.  Does report that she drinks up to ~ 1 hour prior to bedtime.  Denies caffeineated or sugary beverages at night.    The following portions of the patient's history were reviewed and updated as appropriate: allergies, current medications, past family history, past medical history, past social history, past surgical history and problem list.  Review of Systems Pertinent items noted in HPI and remainder of comprehensive ROS otherwise negative.   Objective:   Blood pressure 147/74, pulse 65, resp. rate 16, height 5\' 2"  (1.575 m), weight 249 lb (112.946 kg). General appearance: alert and no distress Abdomen: soft, non-tender; bowel sounds normal; no masses,  no organomegaly Pelvic: The patient's size 2 ring with support pessary was removed, cleaned and replaced without complications. Speculum examination revealed normal vaginal mucosa with no lesions or lacerations. Extremities: extremities normal, atraumatic, no cyanosis or edema Neurologic: Grossly normal   Assessment:   Stress urinary incontinence Nocturia Pessary maintenance  Plan:   The patient should return in 3 months for a pessary check and continue to use Trimosan  1-2 times weekly as prescribed. Advised on limiting fluid intake ~ 2 hours prior, as well as voiding prior to bedtime.  If still no relief, can discuss medications at next visit.    Rubie Maid, MD [facility]

## 2015-10-27 ENCOUNTER — Other Ambulatory Visit: Payer: Self-pay | Admitting: Family Medicine

## 2015-10-27 ENCOUNTER — Other Ambulatory Visit: Payer: Self-pay | Admitting: Adult Health

## 2015-10-27 DIAGNOSIS — R600 Localized edema: Secondary | ICD-10-CM

## 2015-11-02 ENCOUNTER — Ambulatory Visit
Admission: RE | Admit: 2015-11-02 | Discharge: 2015-11-02 | Disposition: A | Discharge status: Home / Self Care | Payer: PPO | Source: Ambulatory Visit | Attending: Family Medicine | Admitting: Family Medicine

## 2015-11-02 DIAGNOSIS — R600 Localized edema: Secondary | ICD-10-CM | POA: Diagnosis not present

## 2015-11-30 ENCOUNTER — Ambulatory Visit (INDEPENDENT_AMBULATORY_CARE_PROVIDER_SITE_OTHER): Payer: PPO | Admitting: Obstetrics and Gynecology

## 2015-11-30 ENCOUNTER — Encounter: Payer: Self-pay | Admitting: Obstetrics and Gynecology

## 2015-11-30 VITALS — BP 163/78 | HR 77 | Ht 62.0 in | Wt 240.0 lb

## 2015-11-30 DIAGNOSIS — N8110 Cystocele, unspecified: Secondary | ICD-10-CM

## 2015-11-30 DIAGNOSIS — Z87440 Personal history of urinary (tract) infections: Secondary | ICD-10-CM | POA: Diagnosis not present

## 2015-11-30 DIAGNOSIS — R32 Unspecified urinary incontinence: Secondary | ICD-10-CM

## 2015-11-30 DIAGNOSIS — Z9289 Personal history of other medical treatment: Secondary | ICD-10-CM | POA: Diagnosis not present

## 2015-11-30 DIAGNOSIS — IMO0001 Reserved for inherently not codable concepts without codable children: Secondary | ICD-10-CM

## 2015-11-30 DIAGNOSIS — Z960 Presence of urogenital implants: Secondary | ICD-10-CM

## 2015-11-30 DIAGNOSIS — IMO0002 Reserved for concepts with insufficient information to code with codable children: Principal | ICD-10-CM

## 2015-11-30 MED ORDER — TOLTERODINE TARTRATE ER 2 MG PO CP24: 2.0000 mg | ORAL_CAPSULE | Freq: Every day | ORAL | Status: DC

## 2015-11-30 NOTE — Progress Notes (Signed)
    GYNECOLOGY PROGRESS NOTE  Subjective:    Patient ID: Abigail Horne, female    DOB: 15-Apr-1946, 70 y.o.   MRN: [id number]  HPI  Patient is a 70 y.o. G77P2002 female who presents for a pessary check  Pessary in place for cystocele (Grade 2), recurrent UTI's, and stress urinary incontinence. She reports no vaginal bleeding or discharge. She denies pelvic discomfort and difficulty urinating or moving her bowels.  Patient does note that she is still having leakage despite pessary use, mostly when getting up in the mornings.  Is also noting some urgency. Denies dysuria, hematuria, frequency.   The following portions of the patient's history were reviewed and updated as appropriate: allergies, current medications, past family history, past medical history, past social history, past surgical history and problem list.  Review of Systems Pertinent items noted in HPI and remainder of comprehensive ROS otherwise negative.   Objective:   Blood pressure 163/78, pulse 77, height 5\' 2"  (1.575 m), weight 240 lb (108.863 kg). General appearance: alert and no distress Pelvis: Speculum examination revealed normal vaginal mucosa with no lesions or lacerations.  Size 1 ring with support pessary removed, cleaned, and replaced without difficulty.   Assessment:  Vaginal pessary in situ Cystocele (Grade 2)  H/o recurrent UTI's Stress urinary incontinence, also now with urge  Plan:  The patient should return in 10-12 weeks for a pessary check and continue to use Trimo-San gelweekly as prescribed. Discussed alternative treatment options for urinary incontinence (stress), including sling placement, pelvic floor PT, medications. Attempted to fit for pessary with knob at a previous visit, however was very uncomfortable to patient. Discussed trial of medication for urinary incontinence as there appears to be an urge component.  Will prescribe Detrol.  Advised to take in the evening. To f/u with medication at  next appointment.    A total of 15 minutes were spent face-to-face with the patient during this encounter and over half of that time dealt with counseling and coordination of care.  Rubie Maid, MD [facility]

## 2016-01-03 ENCOUNTER — Encounter: Payer: Self-pay | Admitting: Urology

## 2016-01-03 ENCOUNTER — Ambulatory Visit (INDEPENDENT_AMBULATORY_CARE_PROVIDER_SITE_OTHER): Payer: PPO | Admitting: Urology

## 2016-01-03 VITALS — BP 129/79 | HR 65 | Ht 62.0 in | Wt 237.1 lb

## 2016-01-03 DIAGNOSIS — N8110 Cystocele, unspecified: Secondary | ICD-10-CM | POA: Diagnosis not present

## 2016-01-03 DIAGNOSIS — IMO0002 Reserved for concepts with insufficient information to code with codable children: Secondary | ICD-10-CM

## 2016-01-03 DIAGNOSIS — IMO0001 Reserved for inherently not codable concepts without codable children: Secondary | ICD-10-CM

## 2016-01-03 DIAGNOSIS — N952 Postmenopausal atrophic vaginitis: Secondary | ICD-10-CM

## 2016-01-03 DIAGNOSIS — N390 Urinary tract infection, site not specified: Secondary | ICD-10-CM

## 2016-01-03 LAB — URINALYSIS, COMPLETE
BILIRUBIN UA: NEGATIVE
Glucose, UA: NEGATIVE
Ketones, UA: NEGATIVE
NITRITE UA: POSITIVE — AB
PH UA: 7 (ref 5.0–7.5)
Protein, UA: NEGATIVE
RBC UA: NEGATIVE
Specific Gravity, UA: 1.02 (ref 1.005–1.030)
Urobilinogen, Ur: 4 mg/dL — ABNORMAL HIGH (ref 0.2–1.0)

## 2016-01-03 LAB — MICROSCOPIC EXAMINATION

## 2016-01-03 LAB — BLADDER SCAN AMB NON-IMAGING: Scan Result: 0

## 2016-01-03 NOTE — Progress Notes (Signed)
01/03/2016 10:32 AM   Abigail Horne 10/15/1946 [id number]  Referring provider: Maryland Pink, MD [address] [facility] [place], [place] [address]  Chief Complaint  Patient presents with  . Recurrent UTI    1 year recheck    HPI: Patient is a 70 year old Caucasian female with a cystocele, history of recurrent UTI's and atrophic vaginitis who presents today for a yearly recheck.  Cystocele Patient's cystocele is managed with a pessary.  She is seeing Dr. Marcelline Mates for maintenance and cleaning every three months.  She is not reporting any difficulty with the pessary today.  Recurrent UTI's Patient has not had any recent UTI's.  Her last documented UTI was on 08/10/2015.  She is not having symptoms at this time.    Atrophic vaginitis Patient is using her vaginal estrogen cream as prescribed.  She is not experiencing any vaginal burning or irritation.     PMH: Past Medical History  Diagnosis Date  . Liver disease   . Hx-TIA (transient ischemic attack)   . BIH (benign intracranial hypertension)   . Hyperlipidemia   . Cervix abnormality   . Arthritis   . Hypertension   . Cirrhosis ([place])   . Cystocele   . Nocturia   . Over weight   . Atrophic vaginitis   . Urethral caruncle   . Chronic cystitis   . Urinary urgency   . Bladder calculus   . Trigonitis   . Urethral stricture   . Anxiety   . GERD (gastroesophageal reflux disease)     Surgical History: Past Surgical History  Procedure Laterality Date  . Bladder surgery  1999    tack  . Appendectomy  1955  . Gastric bypass      lap band  . Arthroscopic knee surgery Left 12/2014    meniscus  . Vaginal hysterectomy  1976    Home Medications:    Medication List       This list is accurate as of: 01/03/16 10:32 AM.  Always use your most recent med list.               ALPRAZolam 0.25 MG tablet  Commonly known as:  XANAX  Reported on 01/03/2016     estradiol 0.1 MG/GM vaginal cream  Commonly  known as:  ESTRACE  Place 1 Applicatorful vaginally at bedtime.     FLUZONE HIGH-DOSE 0.5 ML Susy  Generic drug:  Influenza Vac Split High-Dose  Reported on 01/03/2016     furosemide 20 MG tablet  Commonly known as:  LASIX  Take by mouth.     lisinopril-hydrochlorothiazide 20-25 MG tablet  Commonly known as:  PRINZIDE,ZESTORETIC  Take 1 tablet by mouth daily.     meloxicam 15 MG tablet  Commonly known as:  MOBIC  Take by mouth.     metoprolol succinate 50 MG 24 hr tablet  Commonly known as:  TOPROL-XL  Take 50 mg by mouth daily. Take with or immediately following a meal.     naproxen sodium 220 MG tablet  Commonly known as:  ANAPROX  Take 220 mg by mouth as needed.     omeprazole 20 MG capsule  Commonly known as:  PRILOSEC  Take 20 mg by mouth daily.     PRESERVISION/LUTEIN PO  Take by mouth.     sertraline 100 MG tablet  Commonly known as:  ZOLOFT  Take by mouth.     tolterodine 2 MG 24 hr capsule  Commonly  known as:  DETROL LA  Take 1 capsule (2 mg total) by mouth daily.     traMADol 50 MG tablet  Commonly known as:  ULTRAM  Take by mouth.        Allergies:  Allergies  Allergen Reactions  . Contrast Media [Iodinated Diagnostic Agents] Other (See Comments)    irregular heartbeat.    Family History: Family History  Problem Relation Age of Onset  . Heart attack Father   . Heart disease Father   . Alzheimer's disease Mother   . Diabetes Mother   . Kidney disease Neg Hx   . Bladder Cancer Neg Hx   . Cancer Neg Hx   . Diabetes Brother     Social History:  reports that she has never smoked. She does not have any smokeless tobacco history on file. She reports that she does not drink alcohol or use illicit drugs.  ROS: UROLOGY Frequent Urination?: No Hard to postpone urination?: No Burning/pain with urination?: No Get up at night to urinate?: No Leakage of urine?: Yes Urine stream starts and stops?: No Trouble starting stream?: No Do you have to  strain to urinate?: No Blood in urine?: No Urinary tract infection?: No Sexually transmitted disease?: No Injury to kidneys or bladder?: No Painful intercourse?: No Weak stream?: No Currently pregnant?: No Vaginal bleeding?: No Last menstrual period?: n  Gastrointestinal Nausea?: No Vomiting?: No Indigestion/heartburn?: Yes Diarrhea?: No Constipation?: No  Constitutional Fever: No Night sweats?: No Weight loss?: No Fatigue?: No  Skin Skin rash/lesions?: No Itching?: Yes  Eyes Blurred vision?: No Double vision?: No  Ears/Nose/Throat Sore throat?: No Sinus problems?: Yes  Hematologic/Lymphatic Swollen glands?: No Easy bruising?: No  Cardiovascular Leg swelling?: Yes Chest pain?: No  Respiratory Cough?: No Shortness of breath?: No  Endocrine Excessive thirst?: No  Musculoskeletal Back pain?: No Joint pain?: Yes  Neurological Headaches?: No Dizziness?: No  Psychologic Depression?: No Anxiety?: No  Physical Exam: BP 129/79 mmHg  Pulse 65  Ht 5\' 2"  (1.575 m)  Wt 237 lb 1.6 oz (107.548 kg)  BMI 43.36 kg/m2  Constitutional: Well nourished. Alert and oriented, No acute distress. HEENT: [place] AT, moist mucus membranes. Trachea midline, no masses. Cardiovascular: No clubbing, cyanosis, or edema. Respiratory: Normal respiratory effort, no increased work of breathing. GI: Abdomen is soft, non tender, non distended, no abdominal masses. Liver and spleen not palpable.  No hernias appreciated.  Stool sample for occult testing is not indicated.   GU: No CVA tenderness.  No bladder fullness or masses.  Atrophic external genitalia, normal pubic hair distribution, no lesions.  Normal urethral meatus, no lesions, no prolapse, no discharge.   No urethral masses, tenderness and/or tenderness. No bladder fullness, tenderness or masses. Normal vagina mucosa, good estrogen effect, no discharge, no lesions, good pelvic support, Pessary in place.   Anus and perineum are  without rashes or lesions.    Skin: No rashes, bruises or suspicious lesions. Lymph: No cervical or inguinal adenopathy. Neurologic: Grossly intact, no focal deficits, moving all 4 extremities. Psychiatric: Normal mood and affect.  Laboratory Data: Pertinent Imaging: Results for Abigail, Horne (MRN [id number]) as of 01/03/2016 16:55  Ref. Range 01/03/2016 10:11  Scan Result Unknown 0    Assessment & Plan:    1. Cystocele:   Managed with a pessary through Dr. Andreas Blower office.  2. Recurrent UTI:   Asymptomatic today.    - Urinalysis, Complete - BLADDER SCAN AMB NON-IMAGING  3. Atrophic vaginitis:   She is  using the cream as prescribed.  She finds it cost prohibitive, so I have given her a sample.     Return in about 1 year (around 01/02/2017) for PVR and exam.  These notes generated with voice recognition software. I apologize for typographical errors.  Zara Council, [facility] [address], [place], [place] [address] [phone]

## 2016-02-08 ENCOUNTER — Encounter: Payer: Self-pay | Admitting: Obstetrics and Gynecology

## 2016-02-08 ENCOUNTER — Ambulatory Visit (INDEPENDENT_AMBULATORY_CARE_PROVIDER_SITE_OTHER): Payer: PPO | Admitting: Obstetrics and Gynecology

## 2016-02-08 VITALS — BP 130/72 | HR 67 | Ht 62.0 in | Wt 238.7 lb

## 2016-02-08 DIAGNOSIS — N390 Urinary tract infection, site not specified: Secondary | ICD-10-CM | POA: Diagnosis not present

## 2016-02-08 DIAGNOSIS — N3946 Mixed incontinence: Secondary | ICD-10-CM

## 2016-02-08 DIAGNOSIS — Z9289 Personal history of other medical treatment: Secondary | ICD-10-CM

## 2016-02-08 DIAGNOSIS — N8110 Cystocele, unspecified: Secondary | ICD-10-CM

## 2016-02-08 DIAGNOSIS — IMO0001 Reserved for inherently not codable concepts without codable children: Secondary | ICD-10-CM

## 2016-02-08 DIAGNOSIS — Z960 Presence of urogenital implants: Secondary | ICD-10-CM | POA: Insufficient documentation

## 2016-02-08 DIAGNOSIS — IMO0002 Reserved for concepts with insufficient information to code with codable children: Secondary | ICD-10-CM

## 2016-02-08 NOTE — Progress Notes (Signed)
    GYNECOLOGY PROGRESS NOTE  Subjective:    Patient ID: Abigail Horne, female    DOB: 12-Dec-1945, 70 y.o.   MRN: [id number]  HPI  Patient is a 70 y.o. G67P2002 female who presents for a pessary check  Pessary in place for cystocele (Grade 2), recurrent UTI's, and stress urinary incontinence. She reports no vaginal bleeding or discharge. She denies pelvic discomfort and difficulty urinating or moving her bowels. Notes improvement in urgency symptoms with Detrol.  Denies any adverse side effects from medication. Denies other complaints today.   The following portions of the patient's history were reviewed and updated as appropriate: allergies, current medications, past family history, past medical history, past social history, past surgical history and problem list.  Review of Systems Pertinent items noted in HPI and remainder of comprehensive ROS otherwise negative.   Objective:   Blood pressure 130/72, pulse 67, height 5\' 2"  (1.575 m), weight 238 lb 11.2 oz (108.274 kg). General appearance: alert and no distress Pelvis: Speculum examination revealed normal vaginal mucosa with no lesions or lacerations.  Size 1 ring with support pessary removed, cleaned, and replaced without difficulty.   Assessment:  Vaginal pessary in situ Cystocele (Grade 2)  H/o recurrent UTI's Mixed urinary incontinence  Plan:  The patient should return in 10-12 weeks for a pessary check and continue to use Trimo-San gelweekly as prescribed. Continue Detrol as prescribed.      Rubie Maid, MD [facility]

## 2016-02-29 DIAGNOSIS — R945 Abnormal results of liver function studies: Secondary | ICD-10-CM | POA: Diagnosis not present

## 2016-02-29 DIAGNOSIS — J302 Other seasonal allergic rhinitis: Secondary | ICD-10-CM | POA: Diagnosis not present

## 2016-02-29 DIAGNOSIS — I10 Essential (primary) hypertension: Secondary | ICD-10-CM | POA: Diagnosis not present

## 2016-03-05 DIAGNOSIS — M1712 Unilateral primary osteoarthritis, left knee: Secondary | ICD-10-CM | POA: Diagnosis not present

## 2016-04-10 DIAGNOSIS — M1712 Unilateral primary osteoarthritis, left knee: Secondary | ICD-10-CM | POA: Diagnosis not present

## 2016-05-06 DIAGNOSIS — L718 Other rosacea: Secondary | ICD-10-CM | POA: Diagnosis not present

## 2016-05-06 DIAGNOSIS — L570 Actinic keratosis: Secondary | ICD-10-CM | POA: Diagnosis not present

## 2016-05-06 DIAGNOSIS — D485 Neoplasm of uncertain behavior of skin: Secondary | ICD-10-CM | POA: Diagnosis not present

## 2016-05-13 ENCOUNTER — Other Ambulatory Visit: Payer: Self-pay | Admitting: Adult Health

## 2016-05-13 DIAGNOSIS — Z87898 Personal history of other specified conditions: Secondary | ICD-10-CM

## 2016-05-13 DIAGNOSIS — R399 Unspecified symptoms and signs involving the genitourinary system: Secondary | ICD-10-CM | POA: Diagnosis not present

## 2016-05-14 ENCOUNTER — Ambulatory Visit: Payer: PPO | Admitting: Obstetrics and Gynecology

## 2016-05-14 DIAGNOSIS — R399 Unspecified symptoms and signs involving the genitourinary system: Secondary | ICD-10-CM | POA: Diagnosis not present

## 2016-05-15 ENCOUNTER — Ambulatory Visit
Admission: RE | Admit: 2016-05-15 | Discharge: 2016-05-15 | Disposition: A | Discharge status: Home / Self Care | Payer: PPO | Source: Ambulatory Visit | Attending: Adult Health | Admitting: Adult Health

## 2016-05-15 DIAGNOSIS — I6523 Occlusion and stenosis of bilateral carotid arteries: Secondary | ICD-10-CM | POA: Insufficient documentation

## 2016-05-15 DIAGNOSIS — Z87898 Personal history of other specified conditions: Secondary | ICD-10-CM | POA: Diagnosis not present

## 2016-05-20 DIAGNOSIS — R55 Syncope and collapse: Secondary | ICD-10-CM | POA: Diagnosis not present

## 2016-05-30 DIAGNOSIS — M1712 Unilateral primary osteoarthritis, left knee: Secondary | ICD-10-CM | POA: Diagnosis not present

## 2016-05-30 DIAGNOSIS — M25562 Pain in left knee: Secondary | ICD-10-CM | POA: Diagnosis not present

## 2016-06-11 DIAGNOSIS — L570 Actinic keratosis: Secondary | ICD-10-CM | POA: Diagnosis not present

## 2016-06-11 DIAGNOSIS — L718 Other rosacea: Secondary | ICD-10-CM | POA: Diagnosis not present

## 2016-06-12 ENCOUNTER — Other Ambulatory Visit: Payer: Self-pay | Admitting: Orthopedic Surgery

## 2016-06-12 ENCOUNTER — Encounter: Payer: Self-pay | Admitting: Obstetrics and Gynecology

## 2016-06-12 ENCOUNTER — Ambulatory Visit (INDEPENDENT_AMBULATORY_CARE_PROVIDER_SITE_OTHER): Payer: PPO | Admitting: Obstetrics and Gynecology

## 2016-06-12 VITALS — BP 174/73 | HR 69 | Ht 62.0 in | Wt 236.7 lb

## 2016-06-12 DIAGNOSIS — IMO0001 Reserved for inherently not codable concepts without codable children: Secondary | ICD-10-CM

## 2016-06-12 DIAGNOSIS — Z960 Presence of urogenital implants: Secondary | ICD-10-CM

## 2016-06-12 DIAGNOSIS — Z9289 Personal history of other medical treatment: Secondary | ICD-10-CM | POA: Diagnosis not present

## 2016-06-12 DIAGNOSIS — N3946 Mixed incontinence: Secondary | ICD-10-CM

## 2016-06-12 DIAGNOSIS — N952 Postmenopausal atrophic vaginitis: Secondary | ICD-10-CM

## 2016-06-12 DIAGNOSIS — N8110 Cystocele, unspecified: Secondary | ICD-10-CM

## 2016-06-12 DIAGNOSIS — IMO0002 Reserved for concepts with insufficient information to code with codable children: Principal | ICD-10-CM

## 2016-06-12 DIAGNOSIS — M1712 Unilateral primary osteoarthritis, left knee: Secondary | ICD-10-CM

## 2016-06-12 NOTE — Progress Notes (Signed)
    GYNECOLOGY PROGRESS NOTE  Subjective:    Patient ID: Abigail Horne, female    DOB: 1946/08/19, 70 y.o.   MRN: [id number]  HPI  Patient is a 70 y.o. G42P2002 female who presents for a pessary check.  Pessary in place for cystocele (Grade 2), recurrent UTI's, and mixed urinary incontinence. She reports no vaginal bleeding or discharge. She denies pelvic discomfort and difficulty urinating or moving her bowels. Notes continued improvement in urgency symptoms with Detrol, however noting stress incontinence is getting slightly worse Denies other complaints today.   The following portions of the patient's history were reviewed and updated as appropriate: allergies, current medications, past family history, past medical history, past social history, past surgical history and problem list.  Review of Systems Pertinent items noted in HPI and remainder of comprehensive ROS otherwise negative.   Objective:   Blood pressure 174/73, pulse 69, height 5\' 2"  (1.575 m), weight 236 lb 11.2 oz (107.366 kg). General appearance: alert and no distress Pelvis: Speculum examination revealed normal vaginal mucosa with no lesions or lacerations.  Size 1 ring with support pessary removed, cleaned, and replaced without difficulty.   Assessment:  Vaginal pessary in situ Cystocele (Grade 2)  H/o recurrent UTI's Mixed urinary incontinence Mild vaginal atrophy  Plan:  The patient should return in 10-12 weeks for a pessary check and continue to use Trimo-San gelweekly as prescribed. Continue Detrol as prescribed.   Discussed Kegel exercises, can refer to pelvic floor physical therapy as needed for worsening stress incontinence.  Patient declines referral at this time, states that it is not too bad.    Rubie Maid, MD [facility]

## 2016-06-12 NOTE — Patient Instructions (Signed)
Kegel Exercises  The goal of Kegel exercises is to isolate and exercise your pelvic floor muscles. These muscles act as a hammock that supports the rectum, vagina, small intestine, and uterus. As the muscles weaken, the hammock sags and these organs are displaced from their normal positions. Kegel exercises can strengthen your pelvic floor muscles and help you to improve bladder and bowel control, improve sexual response, and help reduce many problems and some discomfort during pregnancy. Kegel exercises can be done anywhere and at any time.  HOW TO PERFORM KEGEL EXERCISES  1. Locate your pelvic floor muscles. To do this, squeeze (contract) the muscles that you use when you try to stop the flow of urine. You will feel a tightness in the vaginal area (women) and a tight lift in the rectal area (men and women).  2. When you begin, contract your pelvic muscles tight for 2-5 seconds, then relax them for 2-5 seconds. This is one set. Do 4-5 sets with a short pause in between.  3. Contract your pelvic muscles for 8-10 seconds, then relax them for 8-10 seconds. Do 4-5 sets. If you cannot contract your pelvic muscles for 8-10 seconds, try 5-7 seconds and work your way up to 8-10 seconds. Your goal is 4-5 sets of 10 contractions each day.  Keep your stomach, buttocks, and legs relaxed during the exercises. Perform sets of both short and long contractions. Vary your positions. Perform these contractions 3-4 times per day. Perform sets while you are:    · Lying in bed in the morning.  · Standing at lunch.  · Sitting in the late afternoon.  · Lying in bed at night.   You should do 40-50 contractions per day. Do not perform more Kegel exercises per day than recommended. Overexercising can cause muscle fatigue. Continue these exercises for for at least 15-20 weeks or as directed by your caregiver.     This information is not intended to replace advice given to you by your health care provider. Make sure you discuss any questions  you have with your health care provider.     Document Released: 10/28/2012 Document Revised: 12/02/2014 Document Reviewed: 10/28/2012  Elsevier Interactive Patient Education ©2016 [employer]

## 2016-06-14 DIAGNOSIS — E782 Mixed hyperlipidemia: Secondary | ICD-10-CM | POA: Diagnosis not present

## 2016-06-14 DIAGNOSIS — R55 Syncope and collapse: Secondary | ICD-10-CM | POA: Diagnosis not present

## 2016-06-14 DIAGNOSIS — I10 Essential (primary) hypertension: Secondary | ICD-10-CM | POA: Diagnosis not present

## 2016-06-18 ENCOUNTER — Ambulatory Visit
Admission: RE | Admit: 2016-06-18 | Discharge: 2016-06-18 | Disposition: A | Discharge status: Home / Self Care | Payer: PPO | Source: Ambulatory Visit | Attending: Orthopedic Surgery | Admitting: Orthopedic Surgery

## 2016-06-18 DIAGNOSIS — M179 Osteoarthritis of knee, unspecified: Secondary | ICD-10-CM | POA: Diagnosis not present

## 2016-06-18 DIAGNOSIS — Z01818 Encounter for other preprocedural examination: Secondary | ICD-10-CM | POA: Diagnosis not present

## 2016-06-18 DIAGNOSIS — M1712 Unilateral primary osteoarthritis, left knee: Secondary | ICD-10-CM

## 2016-06-26 DIAGNOSIS — N390 Urinary tract infection, site not specified: Secondary | ICD-10-CM | POA: Diagnosis not present

## 2016-07-02 DIAGNOSIS — E782 Mixed hyperlipidemia: Secondary | ICD-10-CM | POA: Diagnosis not present

## 2016-07-02 DIAGNOSIS — I10 Essential (primary) hypertension: Secondary | ICD-10-CM | POA: Diagnosis not present

## 2016-07-02 DIAGNOSIS — R55 Syncope and collapse: Secondary | ICD-10-CM | POA: Diagnosis not present

## 2016-07-02 DIAGNOSIS — K219 Gastro-esophageal reflux disease without esophagitis: Secondary | ICD-10-CM | POA: Diagnosis not present

## 2016-07-02 DIAGNOSIS — I6523 Occlusion and stenosis of bilateral carotid arteries: Secondary | ICD-10-CM | POA: Diagnosis not present

## 2016-07-10 ENCOUNTER — Encounter
Admission: RE | Admit: 2016-07-10 | Discharge: 2016-07-10 | Disposition: A | Discharge status: Home / Self Care | Payer: PPO | Source: Ambulatory Visit | Attending: Orthopedic Surgery | Admitting: Orthopedic Surgery

## 2016-07-10 DIAGNOSIS — Z01812 Encounter for preprocedural laboratory examination: Secondary | ICD-10-CM | POA: Insufficient documentation

## 2016-07-10 HISTORY — DX: Stress incontinence (female) (male): N39.3

## 2016-07-10 HISTORY — DX: Cystocele, unspecified: N81.10

## 2016-07-10 HISTORY — DX: Other seasonal allergic rhinitis: J30.2

## 2016-07-10 LAB — URINALYSIS COMPLETE WITH MICROSCOPIC (ARMC ONLY)
BILIRUBIN URINE: NEGATIVE
Glucose, UA: NEGATIVE mg/dL
HGB URINE DIPSTICK: NEGATIVE
KETONES UR: NEGATIVE mg/dL
LEUKOCYTES UA: NEGATIVE
NITRITE: NEGATIVE
PH: 5 (ref 5.0–8.0)
Protein, ur: NEGATIVE mg/dL
SPECIFIC GRAVITY, URINE: 1.015 (ref 1.005–1.030)

## 2016-07-10 LAB — BASIC METABOLIC PANEL
ANION GAP: 4 — AB (ref 5–15)
BUN: 22 mg/dL — ABNORMAL HIGH (ref 6–20)
CALCIUM: 9.1 mg/dL (ref 8.9–10.3)
CHLORIDE: 106 mmol/L (ref 101–111)
CO2: 31 mmol/L (ref 22–32)
Creatinine, Ser: 1.08 mg/dL — ABNORMAL HIGH (ref 0.44–1.00)
GFR calc Af Amer: 59 mL/min — ABNORMAL LOW (ref >60)
GFR calc non Af Amer: 51 mL/min — ABNORMAL LOW (ref >60)
GLUCOSE: 142 mg/dL — AB (ref 65–99)
Potassium: 3.7 mmol/L (ref 3.5–5.1)
Sodium: 141 mmol/L (ref 135–145)

## 2016-07-10 LAB — CBC
HCT: 37.7 % (ref 35.0–47.0)
HEMOGLOBIN: 12.5 g/dL (ref 12.0–16.0)
MCH: 31 pg (ref 26.0–34.0)
MCHC: 33 g/dL (ref 32.0–36.0)
MCV: 93.7 fL (ref 80.0–100.0)
PLATELETS: 111 10*3/uL — AB (ref 150–440)
RBC: 4.03 MIL/uL (ref 3.80–5.20)
RDW: 14.6 % — ABNORMAL HIGH (ref 11.5–14.5)
WBC: 4.1 10*3/uL (ref 3.6–11.0)

## 2016-07-10 LAB — PROTIME-INR
INR: 1.04
Prothrombin Time: 13.6 seconds (ref 11.4–15.2)

## 2016-07-10 LAB — SURGICAL PCR SCREEN
MRSA, PCR: NEGATIVE
Staphylococcus aureus: NEGATIVE

## 2016-07-10 LAB — APTT: APTT: 35 s (ref 24–36)

## 2016-07-10 LAB — TYPE AND SCREEN
ABO/RH(D): O POS
Antibody Screen: NEGATIVE

## 2016-07-10 LAB — SEDIMENTATION RATE: Sed Rate: 52 mm/hr — ABNORMAL HIGH (ref 0–30)

## 2016-07-10 NOTE — Pre-Procedure Instructions (Signed)
Cardiac clearance Abigail Dibble, MD - 07/02/2016 1:45 PM EDT Formatting of this note may be different from the original. Established Patient Visit   Chief Complaint: Chief Complaint  Patient presents with  . Follow-up  echo and myoview  . Hypertension  Date of Service: 07/02/2016 Date of Birth: 02/07/1946 PCP: Macie Burows, MD  History of Present Illness: Ms. Abigail Horne is a 70 y.o.female patient  Mixed Hyperlipidemia The patient has mixed hyperlipidemia with an LDL of 100 and an HDL of 36. They have other cardiovascular risk factors including age, postmenopausal female, HTN, Hyperlipidemia and sedentary life style. We have had a long discussion of the reasons for medical management of the mixed hyperlipidemia including high LDL, cardiovascular disease and greater than 7.5% ten year cardiovascular score. They have voiced concerns of general medication side effects. We therefore have discussed the risks and benefits of medication management as well as exercise and diet. At this time the patient does not wish to pursue medication management. Essential hypertension The patient currently has a diagnosis of essential hypertension with no evidence of secondary causes of high blood pressure at this time. The patient has been on appropriate medication management without current evidence of significant side effects as well as risk reduction for cardiovascular disease complication which appears stable today. We have discussed treatment goals and current guidelines for hypertension therapy. The patient understands risks and benefits of medication management as well as risk factor modification. They agree to continuation of this current medical regimen. Peripheral Vascular Disease The patient has carotid artery disease without complications over the last months. Causes of the disease include high blood pressure and high cholesterol with current treatment including Beta blocker and Blood pressure  treatment. The patient has not had recent signs and/or symptoms of progression of disease and is currently stable. We have discussed focus on treatment of contributing disease processes including Hypertension and Peripheral vascular disease  GERD The patient has had a diagnosis of gastroesophageal reflux disease without esophagitis recently currently treated with omeprazole (Prilosec) prn and with no change in severity, no change in frequency. Risk factors and concerns include reported heartburn. Symptoms include burning in epigastrum possibly exacerbated by dietary indescretions without concerns for these symptoms suggesting cardiac disease. We have had an important and long discussion about the use of proton pump inhibitors for gastroesophageal reflux disease. They understand that the primary treatment with proton pump inhibitors is to be limited and not indefinite. We have discussed that proton pump inhibitors, in many recent meta analyses, have been associated with increased cardiovascular complication risk up to [id number] versus not taking a proton pump inhibitor. Also discussed was that proton pump inhibitor users are 19% more likely to have an ischemic stroke than nonusers. Lastly we reported that proton pump inhibitor users are at a 3 times increased risk of atrial fibrillation and heart failure episodes. Preoperative Assessment Profile for Knee Surgery Risk factors for cardiac complication with surgery: Positive for: peripheral vascular disease Negative for: Diabetes, Cardiomyopathy or chf, Coronary artery disease and Chronic Kidney disease Active cardiovascular conditions: Positive [id number] Negative for: Angina or anginal equivalent, Recent infartion, Congestive heart failure symptoms, Dysrhythmia and Symptomatic valve disease Functional capacity <4 Mets" Risk of surgery and/or procedure low risk Possible need and adjustments in therapy prior to surgery no Overall risk of cardiac complication  with surgery and/or procedure is low, <1%  Past Medical and Surgical History  Past Medical History Past Medical History:  Diagnosis Date  . Arthritis  . Cervicalgia  .  Diabetes mellitus (CMS-HCC)  . Essential hypertension, benign  . High blood pressure  . Other and unspecified hyperlipidemia  . Unspecified essential hypertension   Past Surgical History She has a past surgical history that includes bladder tack; Laparoscopic Gastroplasty Non-Vertical Banding For Obesity; achilles tendon surgery; Cataract extraction; Colonoscopy (12/2013); and Left knee Arthroscopy (Left, 01/19/15).   Medications and Allergies  Current Medications  Current Outpatient Prescriptions  Medication Sig Dispense Refill  . ciprofloxacin HCl (CIPRO) 250 MG tablet Take 1 tablet (250 mg total) by mouth 2 (two) times daily for 7 days. 14 tablet 0  . estradiol (ESTRACE) 0.01 % (0.1 mg/gram) vaginal cream Place 2 g vaginally once daily.  Marland Kitchen lisinopril-hydrochlorothiazide (PRINZIDE,ZESTORETIC) 20-25 mg tablet Take 1 tablet by mouth once daily. 90 tablet 3  . meloxicam (MOBIC) 15 MG tablet TAKE 1 TABLET(15 MG) BY MOUTH EVERY DAY 30 tablet 3  . metoprolol succinate (TOPROL-XL) 50 MG XL tablet Take 1 tablet (50 mg total) by mouth once daily. 90 tablet 3  . naproxen sodium (ALEVE, ANAPROX) 220 MG tablet Take by mouth.  Marland Kitchen omeprazole (PRILOSEC) 20 MG DR capsule TAKE 1 CAPSULE BY MOUTH EVERY DAY 90 capsule 1  . oxymetazoline (AFRIN) 0.05 % nasal spray Place 2 sprays into both nostrils 2 (two) times daily.  . sertraline (ZOLOFT) 100 MG tablet Take 1.5 tablets (150 mg total) by mouth once daily. 45 tablet 11  . tolterodine (DETROL LA) 2 MG LA capsule Take 2 mg by mouth once daily.  . traMADol (ULTRAM) 50 mg tablet Take 1 tablet (50 mg total) by mouth every 6 (six) hours as needed for Pain. 40 tablet 0   No current facility-administered medications for this visit.   Allergies: Contrast [iodinated contrast- oral and iv  dye]  Social and Family History  Social History reports that she has never smoked. She has never used smokeless tobacco. She reports that she does not drink alcohol or use illicit drugs.  Family History Family History  Problem Relation Age of Onset  . Diabetes type II Mother  . Heart disease Father  . Diabetes type II Brother  . Cancer Brother   Review of Systems   Review of Systems  Positive for knee pain Negative for weight gain weight loss, weakness, vision change, hearing loss, cough, congestion, PND, orthopnea, nausea, diaphoresis, vomiting, diarrhea, bloody stool, melena, stomach pain, leg weakness, leg cramping, leg blood clots, headache, blackouts, nosebleed, trouble swallowing, mouth pain, urinary frequency, urination at night, muscle weakness, skin lesions, skin rashes, tingling ,ulcers, numbness, anxiety, and/or depression Physical Examination   Vitals:BP (P) 132/58  Pulse (P) 62  Resp (P) 14  Ht (P) 157.5 cm (5\' 2" )  Wt (!) (P) 106.6 kg (235 lb)  BMI (P) 42.98 kg/m2 Ht:(P) 157.5 cm (5\' 2" ) Wt:(!) (P) 106.6 kg (235 lb) [id number] surface area is 2.16 meters squared (pended). Body mass index is 42.98 kg/(m^2) (pended). Appearance: well appearing in no acute distress HEENT: Pupils equally reactive to light and accomodation, no xanthalasma  Neck: Supple, no apparent thyromegaly, masses, or lymphadenopathy  Lungs: normal respiratory effort; no rhonchi, no wheezes Heart: Regular rate and rhythm. Normal S1 S2 No gallops, murmur, no rub, PMI is normal size and placement. carotid upstroke normal without bruit. Jugular venous pressure is normal Abdomen: soft, nontender, not distended with normal bowel sounds. No apparent hepatosplenomegally. Abdominal aorta is normal size without bruit Extremities: no edema, no ulcers, no clubbing, no cyanosis Peripheral Pulses: 2+ in upper extremities, 2+ femoral pulses  bilaterally, 2+lower extremity  Musculoskeletal; Normal muscle tone without  kyphosis Neurological: Oriented and Alert, Cranial nerves intact  Assessment   70 y.o. female with  Encounter Diagnoses  Name Primary?  . Mixed hyperlipidemia  . GERD without esophagitis Yes  . Essential hypertension with goal blood pressure less than 140/90  . Bilateral carotid artery stenosis   Plan  -Proceed to surgery and/or invasive procedure without restriction to pre or post operative and/or procedural care. The patient is at lowest risk possible for cardiovascular complications with surgical intervention and/or invasive procedure. Currently has no evidence active and/or significant angina and/or congestive heart failure. The patient may discontinue aspirin 7 days prior to procedure and restart at a safe period thereafter -No use of statin therapy or other medication management for hyperlipidemia for risk reduction of cardiovascular disease due to significant side effects of medication and/or patient wishes not to use medical management at this time. The patient understands the risks and benefits of medication management and wishes to abstain. -Hypertension medication management listed above has been reviewed and discussed with the patient. We will continue current medical regimen at this time for reduction of risk of cardiovascular disease. The patient will report any new or future change of blood pressure for need in potential treatment changes. -Continue current medical regimen of anti-platelet medication and other risk factor management for peripheral vascular disease and stroke risk reduction without change today which appears to be stable. The patient understands future goals of treatment. -Further treatment and discussion of GERD without esophagitis today includes:  Discontinuation of proton pump inhibitor and change to H2 blocker to reduce the possible cardiovascular side effects and/or increased risk of cardiovascular complication  No orders of the defined types were placed in  this encounter.  Return in about 6 months (around 01/02/2017).  Abigail Dibble, MD

## 2016-07-10 NOTE — Pre-Procedure Instructions (Signed)
Normal Lexiscan infusion EKG Normal myocardial perfusion without evidence of myocardial ischemia Low risk scan  Abigail Horne  Result Narrative  CARDIOLOGY DEPARTMENT [facility] [place], [id number] [phone]  Procedure: Pharmacologic Myocardial Perfusion Imaging ONE day procedure  Indication: Syncope and collapse Plan: NM myocardial perfusion SPECT multiple (stress  and rest), ECG stress test only  Ordering Physician:   Dr. Serafina Horne   Clinical History: 70 y.o. year old female Vitals: Height: 47 in Weight: 235 lb Cardiac risk factors include:  Hyperlipidemia, Diabetes, HTN and Obesity    Procedure:  Pharmacologic stress testing was performed with Regadenoson using a single  use 0.4mg /65ml (0.08 mg/ml) prefilled syringe intravenously infused as a  bolus dose. The stress test was stopped due to Infusion completion.Blood  pressure response was normal.   Rest HR: 61bpm Rest BP: 132/74mmHg Max HR: 80bpm Min BP: 130/11mmHg  Stress Test Administered by: Oswald Hillock, CMA  ECG Interpretation: Rest [id number] sinus rhythm, none Stress PH:[id number] sinus rhythm, Recovery PH:[id number] sinus rhythm ECG Interpretation:non-diagnostic due to pharmacologic testing.   Administrations This Visit  regadenoson (LEXISCAN) 0.4 mg/5 mL inj syringe 0.4 mg  Admin Date Action Dose Route Administered By      07/02/2016 Given 0.4 mg Intravenous Scott N Goard, CNMT      technetium Tc5m sestamibi (CARDIOLITE) injection [id number] millicurie  Admin Date Action Dose Route Administered By      [id number] Given [id number] millicurie Intravenous Scott N Goard, CNMT      technetium Tc89m sestamibi (CARDIOLITE) injection [id number] millicurie  Admin Date Action Dose Route Administered By      [id number] Given [id number] millicurie Intravenous Scott N Goard, CNMT        Gated  post-stress perfusion imaging was performed 30 minutes after stress.  Rest images were performed 30 minutes after injection.  Gated LV Analysis:  TID Ratio: 1.07  LVEF= 65%  FINDINGS: Regional wall motion:reveals normal myocardial thickening and wall  motion. The overall quality of the study is fair. Artifacts noted: no Left ventricular cavity: normal.  Perfusion Analysis:SPECT images demonstrate homogeneous tracer  distribution throughout the myocardium.  Status Results Details    Appointment on 07/02/2016 [place]")' href="[email or web address]/">Encounter Summary

## 2016-07-10 NOTE — Pre-Procedure Instructions (Signed)
Component Name Value Ref Range  LV Ejection Fraction (%) 55   Aortic Valve Stenosis Grade none   Aortic Valve Regurgitation Grade none   Aortic Valve Max Velocity (m/s) 1.5 m/sec   Aortic Valve Stenosis Mean Gradient (mmHg) 5.0 mmHg   Mitral Valve Regurgitation Grade mild   Mitral Valve Stenosis Grade none   Tricuspid Valve Regurgitation Grade mild   Tricuspid Valve Regurgitation Max Velocity (m/s) 2.6 m/sec   Right Ventricle Systolic Pressure (mmHg) [id number] mmHg   LV End Diastolic Diameter (cm) 5.3 cm  LV End Systolic Diameter (cm) 3.6 cm  LV Septum Wall Thickness (cm) 0.96 cm  LV Posterior Wall Thickness (cm) 1.0 cm  Left Atrium Diameter (cm) 3.9 cm  Result Narrative  CARDIOLOGY DEPARTMENT TEILA, MOCZYGEMBA [facility]  [place] [id number] [facility]: [id number] [address] [place], [place] [address]Date: 07/02/2016 08:30 AM Adult Female Age: 70 yrs ECHOCARDIOGRAM REPORT Outpatient KC::KCWC  STUDY:CHEST WALL TAPE:0000:00: 0:00:00 MD1:KOWALSKI, BRUCE JAY ECHO:Yes DOPPLER:YesFILE:0000-000-000 BP: 144/82 mmHg  COLOR:YesCONTRAST:No MACHINE:Philips Height: 62 in  RV BIOPSY:No 3D:NoSOUND QLTY:ModerateWeight: 235 lb MEDIUM:None BSA: 2.0 m2  ___________________________________________________________________________________________  HISTORY:Syncope REASON:Assess, LV function INDICATION:R55 Syncope and  collapse ___________________________________________________________________________________________  ECHOCARDIOGRAPHIC MEASUREMENTS 2D DIMENSIONS AORTA ValuesNormal RangeMAIN PAValuesNormal Range Annulus:nm* [2.1 - 2.5]PA Main:nm* [1.5 - 2.1] Aorta Sin:nm* [2.7 - 3.3] RIGHT VENTRICLE ST Junction:nm* [2.3 - 2.9]RV Base:4.0 cm[ < 4.2] Asc.Aorta:nm* [2.3 - 3.1] RV Mid:nm* [ < 3.5]  LEFT VENTRICLERV Length:nm* [ < 8.6] LVIDd:5.3 cm[3.9 - 5.3] INFERIOR VENA CAVA LVIDs:3.6 cmMax. IVC:nm* [ <= 2.1]  FS:32.5 %[> 25]Min. IVC:nm* SWT:0.96 cm [0.5 - 0.9] ------------------ PWT:1.0 cm[0.5 - 0.9] nm* - not measured  LEFT ATRIUM LA Diam:3.9 cm[2.7 - 3.8] LA A4C Area:nm* [ < 20] LA Volume:nm* [22 - 52] ___________________________________________________________________________________________  ECHOCARDIOGRAPHIC DESCRIPTIONS  AORTIC ROOT Size:Normal Dissection:INDETERM FOR DISSECTION  AORTIC VALVE Leaflets:Tricuspid Morphology:MILDLY THICKENED Mobility:Fully mobile  LEFT VENTRICLE Size:NormalAnterior:Normal  Contraction:Normal Lateral:Normal Closest EF:>55% (Estimated)Septal:Normal  LV Masses:No Masses Apical:Normal  [id number] Posterior:Normal Dias.FxClass:(Grade 1) relaxation abnormal, E/A reversal  MITRAL VALVE Leaflets:NormalMobility:Fully  mobile Morphology:ANNULAR CALC  LEFT ATRIUM Size:Normal LA Masses:No masses  IA Septum:Normal IAS  MAIN PA Size:Normal  PULMONIC VALVE Morphology:NormalMobility:Fully mobile  RIGHT VENTRICLE  RV Masses:No Masses Size:Normal  Free Wall:Normal Contraction:Normal  TRICUSPID VALVE Leaflets:NormalMobility:Fully mobile Morphology:Normal  RIGHT ATRIUM Size:NormalRA Other:None  RA Mass:No masses  PERICARDIUM  Fluid:No effusion  INFERIOR VENACAVA Size:Normal Normal respiratory collapse  _____________________________________________________________________  DOPPLER ECHO and OTHER SPECIAL PROCEDURES  Aortic:No ARNo AS 150.0 cm/sec peak vel9.0 mmHg peak grad 5.0 mmHg mean grad 3.1 cm^2 by DOPPLER   Mitral:MILD MRNo MS  3.6 cm^2 by DOPPLER MV Inflow E Vel=120.0 cm/sec MV Annulus E'Vel=6.9 cm/sec E/E'Ratio=17.4  Tricuspid:MILD TRNo TS 262.0 cm/sec peak TR vel 32.5 mmHg peak RV pressure  Pulmonary:TRIVIAL PR No PS    ___________________________________________________________________________________________  INTERPRETATION NORMAL LEFT VENTRICULAR SYSTOLIC FUNCTION WITH AN ESTIMATED EF = 55 % NORMAL RIGHT VENTRICULAR SYSTOLIC FUNCTION MILD TRICUSPID AND MITRAL VALVE INSUFFICIENCY NO VALVULAR STENOSIS  ___________________________________________________________________________________________  Electronically signed by: MD Serafina Royals on 07/02/2016 01:24 PM Performed By: Johnathan Hausen, RDCS, RVT Ordering Physician: Serafina Royals ___________________________________________________________________________________________  Status Results Details    Appointment on 07/02/2016 [place]")' href="[email or web address] Summary

## 2016-07-10 NOTE — Patient Instructions (Signed)
  Your procedure is scheduled on: 07/23/16 Tues Report to Same Day Surgery 2nd floor medical mall To find out your arrival time please call [phone] between 1PM - 3PM on 07/22/16 Mon  Remember: Instructions that are not followed completely may result in serious medical risk, up to and including death, or upon the discretion of your surgeon and anesthesiologist your surgery may need to be rescheduled.    _x___ 1. Do not eat food or drink liquids after midnight. No gum chewing or hard candies.     __x__ 2. No Alcohol for 24 hours before or after surgery.   __x__3. No Smoking for 24 prior to surgery.   ____  4. Bring all medications with you on the day of surgery if instructed.    __x__ 5. Notify your doctor if there is any change in your medical condition     (cold, fever, infections).     Do not wear jewelry, make-up, hairpins, clips or nail polish.  Do not wear lotions, powders, or perfumes. You may wear deodorant.  Do not shave 48 hours prior to surgery. Men may shave face and neck.  Do not bring valuables to the hospital.    [facility] is not responsible for any belongings or valuables.               Contacts, dentures or bridgework may not be worn into surgery.  Leave your suitcase in the car. After surgery it may be brought to your room.  For patients admitted to the hospital, discharge time is determined by your treatment team.   Patients discharged the day of surgery will not be allowed to drive home.    Please read over the following fact sheets that you were given:   [facility] Preparing for Surgery and or MRSA Information   _x___ Take these medicines the morning of surgery with A SIP OF WATER:    1. metoprolol succinate (TOPROL-XL) 50 MG 24 hr tablet  2.omeprazole (PRILOSEC) 20 MG capsule  3.sertraline (ZOLOFT) 100 MG tablet  4.  5.  6.  ____ Fleet Enema (as directed)   _x___ Use CHG Soap or sage wipes as directed on instruction sheet   ____ Use inhalers on  the day of surgery and bring to hospital day of surgery  ____ Stop metformin 2 days prior to surgery    ____ Take 1/2 of usual insulin dose the night before surgery and none on the morning of           surgery.   ____ Stop aspirin or coumadin, or plavix  _x__ Stop Anti-inflammatories such as Advil, Aleve, Ibuprofen, Motrin, Naproxen,          Naprosyn, Goodies powders or aspirin products. Ok to take Tylenol. Stop meloxicam 1 week before surgery   ____ Stop supplements until after surgery.    ____ Bring C-Pap to the hospital.

## 2016-07-11 LAB — URINE CULTURE: CULTURE: NO GROWTH

## 2016-07-23 ENCOUNTER — Inpatient Hospital Stay: Payer: PPO

## 2016-07-23 ENCOUNTER — Encounter: Payer: Self-pay | Admitting: Anesthesiology

## 2016-07-23 ENCOUNTER — Inpatient Hospital Stay: Payer: PPO | Admitting: Anesthesiology

## 2016-07-23 ENCOUNTER — Encounter
Admission: RE | Disposition: A | Discharge status: Skilled Nursing Facility | Payer: Self-pay | Source: Ambulatory Visit | Attending: Orthopedic Surgery

## 2016-07-23 ENCOUNTER — Inpatient Hospital Stay
Admission: RE | Admit: 2016-07-23 | Discharge: 2016-07-26 | DRG: 470 | Disposition: A | Discharge status: Skilled Nursing Facility | Payer: PPO | Source: Ambulatory Visit | Attending: Orthopedic Surgery | Admitting: Orthopedic Surgery

## 2016-07-23 DIAGNOSIS — Z6841 Body Mass Index (BMI) 40.0 and over, adult: Secondary | ICD-10-CM

## 2016-07-23 DIAGNOSIS — Z8673 Personal history of transient ischemic attack (TIA), and cerebral infarction without residual deficits: Secondary | ICD-10-CM

## 2016-07-23 DIAGNOSIS — Z8249 Family history of ischemic heart disease and other diseases of the circulatory system: Secondary | ICD-10-CM

## 2016-07-23 DIAGNOSIS — Z79890 Hormone replacement therapy (postmenopausal): Secondary | ICD-10-CM

## 2016-07-23 DIAGNOSIS — Z9884 Bariatric surgery status: Secondary | ICD-10-CM

## 2016-07-23 DIAGNOSIS — D62 Acute posthemorrhagic anemia: Secondary | ICD-10-CM | POA: Diagnosis not present

## 2016-07-23 DIAGNOSIS — Z96652 Presence of left artificial knee joint: Secondary | ICD-10-CM | POA: Diagnosis not present

## 2016-07-23 DIAGNOSIS — I10 Essential (primary) hypertension: Secondary | ICD-10-CM | POA: Diagnosis not present

## 2016-07-23 DIAGNOSIS — Z833 Family history of diabetes mellitus: Secondary | ICD-10-CM | POA: Diagnosis not present

## 2016-07-23 DIAGNOSIS — E785 Hyperlipidemia, unspecified: Secondary | ICD-10-CM | POA: Diagnosis present

## 2016-07-23 DIAGNOSIS — Z791 Long term (current) use of non-steroidal anti-inflammatories (NSAID): Secondary | ICD-10-CM

## 2016-07-23 DIAGNOSIS — K219 Gastro-esophageal reflux disease without esophagitis: Secondary | ICD-10-CM | POA: Diagnosis not present

## 2016-07-23 DIAGNOSIS — M1712 Unilateral primary osteoarthritis, left knee: Secondary | ICD-10-CM | POA: Diagnosis present

## 2016-07-23 DIAGNOSIS — M179 Osteoarthritis of knee, unspecified: Secondary | ICD-10-CM | POA: Diagnosis not present

## 2016-07-23 DIAGNOSIS — Z471 Aftercare following joint replacement surgery: Secondary | ICD-10-CM | POA: Diagnosis not present

## 2016-07-23 DIAGNOSIS — Z79899 Other long term (current) drug therapy: Secondary | ICD-10-CM

## 2016-07-23 DIAGNOSIS — G8918 Other acute postprocedural pain: Secondary | ICD-10-CM

## 2016-07-23 HISTORY — PX: TOTAL KNEE ARTHROPLASTY: SHX125

## 2016-07-23 LAB — CREATININE, SERUM
CREATININE: 1.1 mg/dL — AB (ref 0.44–1.00)
GFR, EST AFRICAN AMERICAN: 58 mL/min — AB (ref >60)
GFR, EST NON AFRICAN AMERICAN: 50 mL/min — AB (ref >60)

## 2016-07-23 LAB — CBC
HCT: 36.2 % (ref 35.0–47.0)
Hemoglobin: 12.3 g/dL (ref 12.0–16.0)
MCH: 31.4 pg (ref 26.0–34.0)
MCHC: 33.9 g/dL (ref 32.0–36.0)
MCV: 92.6 fL (ref 80.0–100.0)
PLATELETS: 107 10*3/uL — AB (ref 150–440)
RBC: 3.91 MIL/uL (ref 3.80–5.20)
RDW: 14.4 % (ref 11.5–14.5)
WBC: 6.5 10*3/uL (ref 3.6–11.0)

## 2016-07-23 SURGERY — ARTHROPLASTY, KNEE, TOTAL
Anesthesia: Spinal | Site: Knee | Laterality: Left | Wound class: Clean

## 2016-07-23 MED ORDER — MORPHINE SULFATE (PF) 2 MG/ML IV SOLN
INTRAVENOUS | Status: AC
  Filled 2016-07-23: qty 1

## 2016-07-23 MED ORDER — FENTANYL CITRATE (PF) 100 MCG/2ML IJ SOLN
25.0000 ug | INTRAMUSCULAR | Status: DC | PRN
  Administered 2016-07-23 (×4): 25 ug via INTRAVENOUS

## 2016-07-23 MED ORDER — BUPIVACAINE-EPINEPHRINE (PF) 0.25% -1:200000 IJ SOLN
INTRAMUSCULAR | Status: AC
  Filled 2016-07-23: qty 30

## 2016-07-23 MED ORDER — SODIUM CHLORIDE 0.9 % IV SOLN
INTRAVENOUS | Status: DC
  Administered 2016-07-23 (×2): via INTRAVENOUS

## 2016-07-23 MED ORDER — LACTATED RINGERS IV SOLN
INTRAVENOUS | Status: DC
  Administered 2016-07-23 (×3): via INTRAVENOUS

## 2016-07-23 MED ORDER — MAGNESIUM CITRATE PO SOLN: 1.0000 | Freq: Once | ORAL | Status: DC | PRN

## 2016-07-23 MED ORDER — MENTHOL 3 MG MT LOZG: 1.0000 | LOZENGE | OROMUCOSAL | Status: DC | PRN

## 2016-07-23 MED ORDER — CEFAZOLIN SODIUM-DEXTROSE 2-4 GM/100ML-% IV SOLN
2.0000 g | Freq: Once | INTRAVENOUS | Status: AC
  Administered 2016-07-23: 2 g via INTRAVENOUS

## 2016-07-23 MED ORDER — ACETAMINOPHEN 650 MG RE SUPP: 650.0000 mg | Freq: Four times a day (QID) | RECTAL | Status: DC | PRN

## 2016-07-23 MED ORDER — PHENOL 1.4 % MT LIQD
1.0000 | OROMUCOSAL | Status: DC | PRN
Start: 2016-07-23 — End: 2016-07-26

## 2016-07-23 MED ORDER — ONDANSETRON HCL 4 MG/2ML IJ SOLN
4.0000 mg | Freq: Four times a day (QID) | INTRAMUSCULAR | Status: DC | PRN
Start: 2016-07-23 — End: 2016-07-26

## 2016-07-23 MED ORDER — ESTRADIOL 0.1 MG/GM VA CREA
1.0000 | TOPICAL_CREAM | Freq: Every day | VAGINAL | Status: DC
  Administered 2016-07-23 – 2016-07-24 (×2): 1 via VAGINAL
  Filled 2016-07-23: qty 42.5

## 2016-07-23 MED ORDER — ACETAMINOPHEN 325 MG PO TABS: 650.0000 mg | ORAL_TABLET | Freq: Four times a day (QID) | ORAL | Status: DC | PRN

## 2016-07-23 MED ORDER — FENTANYL CITRATE (PF) 100 MCG/2ML IJ SOLN
INTRAMUSCULAR | Status: AC
  Filled 2016-07-23: qty 2

## 2016-07-23 MED ORDER — DIPHENHYDRAMINE HCL 12.5 MG/5ML PO ELIX: 12.5000 mg | ORAL_SOLUTION | ORAL | Status: DC | PRN

## 2016-07-23 MED ORDER — BISACODYL 10 MG RE SUPP
10.0000 mg | Freq: Every day | RECTAL | Status: DC | PRN
  Administered 2016-07-26: 10 mg via RECTAL
  Filled 2016-07-23: qty 1

## 2016-07-23 MED ORDER — SODIUM CHLORIDE 0.9 % IJ SOLN
INTRAMUSCULAR | Status: AC
  Filled 2016-07-23: qty 100

## 2016-07-23 MED ORDER — BUPIVACAINE LIPOSOME 1.3 % IJ SUSP
INTRAMUSCULAR | Status: AC
  Filled 2016-07-23: qty 20

## 2016-07-23 MED ORDER — METOPROLOL SUCCINATE ER 50 MG PO TB24
50.0000 mg | ORAL_TABLET | Freq: Every day | ORAL | Status: DC
  Administered 2016-07-24 – 2016-07-26 (×3): 50 mg via ORAL
  Filled 2016-07-23 (×3): qty 1

## 2016-07-23 MED ORDER — FUROSEMIDE 20 MG PO TABS
20.0000 mg | ORAL_TABLET | Freq: Every day | ORAL | Status: DC
  Administered 2016-07-23 – 2016-07-26 (×4): 20 mg via ORAL
  Filled 2016-07-23 (×4): qty 1

## 2016-07-23 MED ORDER — MORPHINE SULFATE 10 MG/ML IJ SOLN
INTRAMUSCULAR | Status: DC | PRN
  Administered 2016-07-23: 10 mg

## 2016-07-23 MED ORDER — CEFAZOLIN SODIUM-DEXTROSE 2-4 GM/100ML-% IV SOLN
2.0000 g | Freq: Four times a day (QID) | INTRAVENOUS | Status: AC
  Administered 2016-07-23 (×2): 2 g via INTRAVENOUS
  Filled 2016-07-23 (×2): qty 100

## 2016-07-23 MED ORDER — SERTRALINE HCL 100 MG PO TABS
100.0000 mg | ORAL_TABLET | Freq: Every day | ORAL | Status: DC
  Administered 2016-07-24 – 2016-07-26 (×3): 100 mg via ORAL
  Filled 2016-07-23 (×3): qty 1

## 2016-07-23 MED ORDER — BUPIVACAINE-EPINEPHRINE (PF) 0.25% -1:200000 IJ SOLN
INTRAMUSCULAR | Status: DC | PRN
  Administered 2016-07-23: 30 mL via PERINEURAL

## 2016-07-23 MED ORDER — NEOMYCIN-POLYMYXIN B GU 40-200000 IR SOLN
Status: AC
  Filled 2016-07-23: qty 20

## 2016-07-23 MED ORDER — FESOTERODINE FUMARATE ER 4 MG PO TB24
4.0000 mg | ORAL_TABLET | Freq: Every day | ORAL | Status: DC
Start: 2016-07-23 — End: 2016-07-26
  Administered 2016-07-23 – 2016-07-26 (×4): 4 mg via ORAL
  Filled 2016-07-23 (×4): qty 1

## 2016-07-23 MED ORDER — METOCLOPRAMIDE HCL 10 MG PO TABS: 5.0000 mg | ORAL_TABLET | Freq: Three times a day (TID) | ORAL | Status: DC | PRN

## 2016-07-23 MED ORDER — SALINE SPRAY 0.65 % NA SOLN: 1.0000 | Freq: Every day | NASAL | Status: DC | PRN

## 2016-07-23 MED ORDER — MIDAZOLAM HCL 5 MG/5ML IJ SOLN
INTRAMUSCULAR | Status: DC | PRN
  Administered 2016-07-23: 2 mg via INTRAVENOUS

## 2016-07-23 MED ORDER — BUPIVACAINE LIPOSOME 1.3 % IJ SUSP
INTRAMUSCULAR | Status: DC | PRN
  Administered 2016-07-23: 60 mL

## 2016-07-23 MED ORDER — LISINOPRIL 20 MG PO TABS
20.0000 mg | ORAL_TABLET | Freq: Every day | ORAL | Status: DC
  Administered 2016-07-24 – 2016-07-26 (×3): 20 mg via ORAL
  Filled 2016-07-23 (×4): qty 1

## 2016-07-23 MED ORDER — NEOMYCIN-POLYMYXIN B GU 40-200000 IR SOLN
Status: DC | PRN
  Administered 2016-07-23: 16 mL

## 2016-07-23 MED ORDER — METHOCARBAMOL 500 MG PO TABS
500.0000 mg | ORAL_TABLET | Freq: Four times a day (QID) | ORAL | Status: DC | PRN
  Administered 2016-07-26: 500 mg via ORAL
  Filled 2016-07-23: qty 1

## 2016-07-23 MED ORDER — OCUVITE-LUTEIN PO CAPS
1.0000 | ORAL_CAPSULE | Freq: Every day | ORAL | Status: DC
Start: 2016-07-23 — End: 2016-07-26
  Administered 2016-07-24 – 2016-07-26 (×3): 1 via ORAL
  Filled 2016-07-23 (×4): qty 1

## 2016-07-23 MED ORDER — OXYCODONE HCL 5 MG PO TABS
5.0000 mg | ORAL_TABLET | ORAL | Status: DC | PRN
  Administered 2016-07-23: 5 mg via ORAL
  Administered 2016-07-23: 10 mg via ORAL
  Administered 2016-07-23: 5 mg via ORAL
  Administered 2016-07-23 – 2016-07-25 (×8): 10 mg via ORAL
  Administered 2016-07-26 (×2): 5 mg via ORAL
  Filled 2016-07-23: qty 1
  Filled 2016-07-23: qty 2
  Filled 2016-07-23: qty 1
  Filled 2016-07-23 (×2): qty 2
  Filled 2016-07-23 (×2): qty 1
  Filled 2016-07-23 (×6): qty 2

## 2016-07-23 MED ORDER — PANTOPRAZOLE SODIUM 40 MG PO TBEC
40.0000 mg | DELAYED_RELEASE_TABLET | Freq: Two times a day (BID) | ORAL | Status: DC
Start: 2016-07-23 — End: 2016-07-26
  Administered 2016-07-23 – 2016-07-26 (×6): 40 mg via ORAL
  Filled 2016-07-23 (×6): qty 1

## 2016-07-23 MED ORDER — DOCUSATE SODIUM 100 MG PO CAPS
100.0000 mg | ORAL_CAPSULE | Freq: Two times a day (BID) | ORAL | Status: DC
  Administered 2016-07-23 – 2016-07-26 (×7): 100 mg via ORAL
  Filled 2016-07-23 (×7): qty 1

## 2016-07-23 MED ORDER — ONDANSETRON HCL 4 MG PO TABS: 4.0000 mg | ORAL_TABLET | Freq: Four times a day (QID) | ORAL | Status: DC | PRN

## 2016-07-23 MED ORDER — SODIUM CHLORIDE 0.9 % IV SOLN
1500.0000 mg | INTRAVENOUS | Status: AC
  Administered 2016-07-23: 1500 mg via INTRAVENOUS
  Filled 2016-07-23: qty 15

## 2016-07-23 MED ORDER — PROPOFOL 500 MG/50ML IV EMUL
INTRAVENOUS | Status: DC | PRN
  Administered 2016-07-23: 75 ug/kg/min via INTRAVENOUS

## 2016-07-23 MED ORDER — METHOCARBAMOL 1000 MG/10ML IJ SOLN
500.0000 mg | Freq: Four times a day (QID) | INTRAVENOUS | Status: DC | PRN
  Filled 2016-07-23: qty 5

## 2016-07-23 MED ORDER — MORPHINE SULFATE (PF) 10 MG/ML IV SOLN
INTRAVENOUS | Status: AC
  Filled 2016-07-23: qty 1

## 2016-07-23 MED ORDER — MAGNESIUM HYDROXIDE 400 MG/5ML PO SUSP
30.0000 mL | Freq: Every day | ORAL | Status: DC | PRN
  Administered 2016-07-23 – 2016-07-25 (×3): 30 mL via ORAL
  Filled 2016-07-23 (×3): qty 30

## 2016-07-23 MED ORDER — SODIUM CHLORIDE 0.9 % IV SOLN
INTRAVENOUS | Status: DC | PRN
  Administered 2016-07-23: 25 ug/min via INTRAVENOUS

## 2016-07-23 MED ORDER — ONDANSETRON HCL 4 MG/2ML IJ SOLN
4.0000 mg | Freq: Once | INTRAMUSCULAR | Status: AC | PRN
  Administered 2016-07-23: 4 mg via INTRAVENOUS

## 2016-07-23 MED ORDER — METOCLOPRAMIDE HCL 5 MG/ML IJ SOLN: 5.0000 mg | Freq: Three times a day (TID) | INTRAMUSCULAR | Status: DC | PRN

## 2016-07-23 MED ORDER — ENOXAPARIN SODIUM 40 MG/0.4ML ~~LOC~~ SOLN
40.0000 mg | Freq: Two times a day (BID) | SUBCUTANEOUS | Status: DC
  Administered 2016-07-24 – 2016-07-26 (×5): 40 mg via SUBCUTANEOUS
  Filled 2016-07-23 (×5): qty 0.4

## 2016-07-23 MED ORDER — BUPIVACAINE HCL (PF) 0.5 % IJ SOLN
INTRAMUSCULAR | Status: DC | PRN
  Administered 2016-07-23: 3 mL

## 2016-07-23 MED ORDER — LISINOPRIL-HYDROCHLOROTHIAZIDE 20-25 MG PO TABS: 1.0000 | ORAL_TABLET | Freq: Every day | ORAL | Status: DC

## 2016-07-23 MED ORDER — PANTOPRAZOLE SODIUM 40 MG PO TBEC: 80.0000 mg | DELAYED_RELEASE_TABLET | Freq: Every day | ORAL | Status: DC

## 2016-07-23 MED ORDER — MORPHINE SULFATE (PF) 2 MG/ML IV SOLN
2.0000 mg | INTRAVENOUS | Status: DC | PRN
  Administered 2016-07-23 (×2): 2 mg via INTRAVENOUS
  Filled 2016-07-23: qty 1

## 2016-07-23 MED ORDER — KETAMINE HCL 50 MG/ML IJ SOLN
INTRAMUSCULAR | Status: DC | PRN
  Administered 2016-07-23: 50 mg via INTRAMUSCULAR

## 2016-07-23 MED ORDER — HYDROCHLOROTHIAZIDE 25 MG PO TABS
25.0000 mg | ORAL_TABLET | Freq: Every day | ORAL | Status: DC
  Administered 2016-07-24 – 2016-07-26 (×3): 25 mg via ORAL
  Filled 2016-07-23 (×4): qty 1

## 2016-07-23 MED ORDER — ZOLPIDEM TARTRATE 5 MG PO TABS: 5.0000 mg | ORAL_TABLET | Freq: Every evening | ORAL | Status: DC | PRN

## 2016-07-23 SURGICAL SUPPLY — 69 items
BANDAGE ACE 6X5 VEL STRL LF (GAUZE/BANDAGES/DRESSINGS) ×2 IMPLANT
BLADE SAW 1 (BLADE) ×2 IMPLANT
BLOCK CUTTING FEMUR 3+ LT MED (MISCELLANEOUS) IMPLANT
BLOCK CUTTING TIBIAL 3 LT (MISCELLANEOUS) IMPLANT
CANISTER SUCT 1200ML W/VALVE (MISCELLANEOUS) ×2 IMPLANT
CANISTER SUCT 3000ML (MISCELLANEOUS) ×4 IMPLANT
CAPT KNEE TOTAL 3 ×2 IMPLANT
CATH FOL LEG HOLDER (MISCELLANEOUS) ×2 IMPLANT
CATH TRAY METER 16FR LF (MISCELLANEOUS) ×2 IMPLANT
CEMENT HV SMART SET (Cement) ×4 IMPLANT
CHLORAPREP W/TINT 26ML (MISCELLANEOUS) ×4 IMPLANT
COOLER POLAR GLACIER W/PUMP (MISCELLANEOUS) ×2 IMPLANT
CUFF TOURN 24 STER (MISCELLANEOUS) IMPLANT
CUFF TOURN 30 STER DUAL PORT (MISCELLANEOUS) IMPLANT
CUFF TOURN SGL QUICK 34 (TOURNIQUET CUFF) ×1
CUFF TRNQT CYL 34X4X40X1 (TOURNIQUET CUFF) ×1 IMPLANT
DRAPE INCISE IOBAN 66X45 STRL (DRAPES) ×4 IMPLANT
DRAPE SHEET LG 3/4 BI-LAMINATE (DRAPES) ×4 IMPLANT
ELECT CAUTERY BLADE 6.4 (BLADE) ×2 IMPLANT
ELECT REM PT RETURN 9FT ADLT (ELECTROSURGICAL) ×2
ELECTRODE REM PT RTRN 9FT ADLT (ELECTROSURGICAL) ×1 IMPLANT
FENUR BONE MODEL- LEFT IMPLANT
GAUZE PETRO XEROFOAM 1X8 (MISCELLANEOUS) IMPLANT
GAUZE SPONGE 4X4 12PLY STRL (GAUZE/BANDAGES/DRESSINGS) IMPLANT
GLOVE BIO SURGEON STRL SZ7 (GLOVE) ×4 IMPLANT
GLOVE BIOGEL PI IND STRL 9 (GLOVE) ×1 IMPLANT
GLOVE BIOGEL PI INDICATOR 9 (GLOVE) ×1
GLOVE INDICATOR 7.5 STRL GRN (GLOVE) ×6 IMPLANT
GLOVE INDICATOR 8.0 STRL GRN (GLOVE) ×2 IMPLANT
GLOVE SURG ORTHO 8.0 STRL STRW (GLOVE) ×2 IMPLANT
GLOVE SURG ORTHO 9.0 STRL STRW (GLOVE) ×4 IMPLANT
GOWN SRG 2XL LVL 4 RGLN SLV (GOWNS) ×1 IMPLANT
GOWN STRL NON-REIN 2XL LVL4 (GOWNS) ×1
GOWN STRL REUS W/ TWL LRG LVL3 (GOWN DISPOSABLE) ×1 IMPLANT
GOWN STRL REUS W/ TWL XL LVL3 (GOWN DISPOSABLE) ×3 IMPLANT
GOWN STRL REUS W/TWL LRG LVL3 (GOWN DISPOSABLE) ×1
GOWN STRL REUS W/TWL XL LVL3 (GOWN DISPOSABLE) ×3
HANDPIECE INTERPULSE COAX TIP (DISPOSABLE) ×1
HOOD PEEL AWAY FLYTE STAYCOOL (MISCELLANEOUS) ×4 IMPLANT
IMMBOLIZER KNEE 19 BLUE UNIV (SOFTGOODS) ×2 IMPLANT
KIT PREVENA INCISION MGT20CM45 (CANNISTER) ×2 IMPLANT
KIT RM TURNOVER STRD PROC AR (KITS) ×2 IMPLANT
KNEE MEDACTA TIBIAL/FEMORAL BL (Knees) ×2 IMPLANT
KNIFE SCULPS 14X20 (INSTRUMENTS) ×2 IMPLANT
MARKER SKIN DUAL TIP RULER LAB (MISCELLANEOUS) ×2 IMPLANT
NDL SAFETY 18GX1.5 (NEEDLE) ×2 IMPLANT
NEEDLE FILTER BLUNT 18X 1/2SAF (NEEDLE) ×1
NEEDLE FILTER BLUNT 18X1 1/2 (NEEDLE) ×1 IMPLANT
NEEDLE SPNL 18GX3.5 QUINCKE PK (NEEDLE) ×2 IMPLANT
NEEDLE SPNL 20GX3.5 QUINCKE YW (NEEDLE) ×2 IMPLANT
NS IRRIG 1000ML POUR BTL (IV SOLUTION) ×2 IMPLANT
PACK TOTAL KNEE (MISCELLANEOUS) ×2 IMPLANT
PAD WRAPON POLAR KNEE (MISCELLANEOUS) ×1 IMPLANT
SET HNDPC FAN SPRY TIP SCT (DISPOSABLE) ×1 IMPLANT
SOL .9 NS 3000ML IRR  AL (IV SOLUTION) ×1
SOL .9 NS 3000ML IRR UROMATIC (IV SOLUTION) ×1 IMPLANT
STAPLER SKIN PROX 35W (STAPLE) ×2 IMPLANT
SUCTION FRAZIER HANDLE 10FR (MISCELLANEOUS) ×1
SUCTION TUBE FRAZIER 10FR DISP (MISCELLANEOUS) ×1 IMPLANT
SUT DVC 2 QUILL PDO  T11 36X36 (SUTURE) ×1
SUT DVC 2 QUILL PDO T11 36X36 (SUTURE) ×1 IMPLANT
SUT DVC QUILL MONODERM 30X30 (SUTURE) ×2 IMPLANT
SYR 20CC LL (SYRINGE) ×2 IMPLANT
SYR 3ML LL SCALE MARK (SYRINGE) ×2 IMPLANT
SYR 50ML LL SCALE MARK (SYRINGE) ×4 IMPLANT
TIBIAL BONE MODEL LEFT (MISCELLANEOUS) ×2 IMPLANT
TOWEL OR 17X26 4PK STRL BLUE (TOWEL DISPOSABLE) ×2 IMPLANT
TOWER CARTRIDGE SMART MIX (DISPOSABLE) ×2 IMPLANT
WRAPON POLAR PAD KNEE (MISCELLANEOUS) ×2

## 2016-07-23 NOTE — Anesthesia Preprocedure Evaluation (Signed)
Anesthesia Evaluation  Patient identified by MRN, date of birth, ID band Patient awake    Reviewed: Allergy & Precautions, NPO status , Patient's Chart, lab work & pertinent test results, reviewed documented beta blocker date and time   Airway Mallampati: III  TM Distance: <3 FB     Dental  (+) Chipped, Caps   Pulmonary neg pulmonary ROS,    Pulmonary exam normal        Cardiovascular hypertension, Pt. on medications and Pt. on home beta blockers Normal cardiovascular exam     Neuro/Psych Anxiety negative neurological ROS     GI/Hepatic Neg liver ROS, GERD  Medicated,  Endo/Other  negative endocrine ROS  Renal/GU negative Renal ROS  negative genitourinary   Musculoskeletal  (+) Arthritis , Osteoarthritis,    Abdominal Normal abdominal exam  (+)   Peds negative pediatric ROS (+)  Hematology negative hematology ROS (+)   Anesthesia Other Findings   Reproductive/Obstetrics                             Anesthesia Physical Anesthesia Plan  ASA: III  Anesthesia Plan: Spinal   Post-op Pain Management:    Induction: Intravenous  Airway Management Planned: Nasal Cannula  Additional Equipment:   Intra-op Plan:   Post-operative Plan:   Informed Consent: I have reviewed the patients History and Physical, chart, labs and discussed the procedure including the risks, benefits and alternatives for the proposed anesthesia with the patient or authorized representative who has indicated his/her understanding and acceptance.   Dental advisory given  Plan Discussed with: CRNA and Surgeon  Anesthesia Plan Comments:         Anesthesia Quick Evaluation

## 2016-07-23 NOTE — Evaluation (Signed)
Physical Therapy Evaluation Patient Details Name: Abigail Horne MRN: [id number] DOB: Aug 24, 1946 Today's Date: 07/23/2016   History of Present Illness  admitted for acute hospitalization status post L TKR, WBAT (8/28).  Clinical Impression  Upon evaluation, patient alert and oriented; follows all commands and demonstrates good insight/safety awareness.  Post-op pain rated at 9-10/10, but patient willing to participate with session as able.  Very guarded with isolated therex and movement transitions, but demonstrates excellent effort with all activities.  L knee ROM 4-52 degrees, strength 3-/5; limited by pain.  Difficulty initiating L quad with isolated therex, but able to activate functionally; unable to complete formal SLR, will continue assessment for possible use of KI with mobility next date (anticipate improved ability with optimal pain control).  Currently requiring min assist for all functional mobility (limited to bed-level); unable to tolerate OOB activities or gait trial this date due to pain.  Will continue mobility assessment/progression next date. Would benefit from skilled PT to address above deficits and promote optimal return to PLOF; recommend transition to STR upon discharge from acute hospitalization.  Will closely monitor progress throughout hospitalization and update recommendations as appropriate.     Follow Up Recommendations SNF    Equipment Recommendations       Recommendations for Other Services       Precautions / Restrictions Precautions Precautions: Fall Restrictions Weight Bearing Restrictions: No      Mobility  Bed Mobility Overal bed mobility: Needs Assistance Bed Mobility: Supine to Sit     Supine to sit: Min assist Sit to supine: Min assist   General bed mobility comments: assist for L LE management  Transfers                 General transfer comment: patient declined due to pain  Ambulation/Gait             General Gait  Details: patient declined due to pain  Stairs            Wheelchair Mobility    Modified Rankin (Stroke Patients Only)       Balance Overall balance assessment: Needs assistance Sitting-balance support: No upper extremity supported;Feet supported Sitting balance-Leahy Scale: Good         Standing balance comment: patient declined due to pain                             Pertinent Vitals/Pain Pain Assessment: 0-10 Pain Score: 10-Worst pain ever Pain Descriptors / Indicators: Aching;Sore Pain Intervention(s): Limited activity within patient's tolerance;Monitored during session;Repositioned;Premedicated before session;Relaxation;Ice applied    Home Living Family/patient expects to be discharged to:: Private residence Living Arrangements: Alone Available Help at Discharge: Family;Available 24 hours/day (cousin presently living with patient) Type of Home: House Home Access: Ramped entrance     Home Layout: One level        Prior Function Level of Independence: Independent with assistive device(s)         Comments: Mod indep for ADLs, household and community mobility with intermittent use of SPC/RW as needed.  Does endorse multiple fall history (5-6 in previous six months), feels related to L knee instability.     Hand Dominance        Extremity/Trunk Assessment   Upper Extremity Assessment: Overall WFL for tasks assessed           Lower Extremity Assessment: Generalized weakness (L knee grossly 3-/5, limited by pain; otherwise, bilat LEs at  least 4+/5 throughout.  Full sensation intact)         Communication   Communication: No difficulties  Cognition Arousal/Alertness: Awake/alert Behavior During Therapy: WFL for tasks assessed/performed Overall Cognitive Status: Within Functional Limits for tasks assessed                      General Comments      Exercises Total Joint Exercises Goniometric ROM: 4-52 degrees, act  assist ROM Other Exercises Other Exercises: Supine LE therex, 1x10, act vs. act assist ROM: ankle pumps, quad sets, SAQs, hip abduct/adduct, SLR (act assist).  Fair L quad contraction, but very guarded due to pain.  With transition to unsupported sitting edge of bed, patient able to maintain L knee in nearly full extension (demonstrating better strength/ability that noted during isolated therex)      Assessment/Plan    PT Assessment Patient needs continued PT services  PT Diagnosis Difficulty walking;Generalized weakness;Acute pain   PT Problem List Decreased strength;Decreased range of motion;Decreased activity tolerance;Decreased balance;Decreased mobility;Decreased knowledge of use of DME;Decreased safety awareness;Decreased knowledge of precautions;Cardiopulmonary status limiting activity;Obesity;Pain  PT Treatment Interventions DME instruction;Gait training;Stair training;Functional mobility training;Therapeutic activities;Therapeutic exercise;Balance training;Patient/family education   PT Goals (Current goals can be found in the Care Plan section) Acute Rehab PT Goals Patient Stated Goal: to return home and have therapy come to the house for a bit PT Goal Formulation: With patient Time For Goal Achievement: 08/06/16 Potential to Achieve Goals: Good    Frequency BID   Barriers to discharge Decreased caregiver support      Co-evaluation               End of Session   Activity Tolerance: Patient limited by pain Patient left: in bed;with call bell/phone within reach;with bed alarm set           Time: 1449-1515 PT Time Calculation (min) (ACUTE ONLY): 26 min   Charges:   PT Evaluation $PT Eval Moderate Complexity: 1 Procedure PT Treatments $Therapeutic Exercise: 8-22 mins   PT G Codes:        [place] H. Owens Shark, PT, DPT, NCS 07/23/16, 3:47 PM [phone]

## 2016-07-23 NOTE — H&P (Signed)
Reviewed paper H+P, will be scanned into chart. No changes noted.  

## 2016-07-23 NOTE — OR Nursing (Signed)
Pt has pessary inplace to help with bladder issues. Pt doesn't remove the pessary herself, says her GYN Dr Marcelline Mates removes it and cleans it each visit.  OR staff notified and CRNA.

## 2016-07-23 NOTE — Transfer of Care (Signed)
Immediate Anesthesia Transfer of Care Note  Patient: Abigail Horne  Procedure(s) Performed: Procedure(s): TOTAL KNEE ARTHROPLASTY (Left)  Patient Location: PACU  Anesthesia Type:Spinal  Level of Consciousness: awake and sedated  Airway & Oxygen Therapy: Patient Spontanous Breathing and Patient connected to face mask oxygen  Post-op Assessment: Report given to RN and Post -op Vital signs reviewed and stable  Post vital signs: Reviewed and stable  Last Vitals:  Vitals:   07/23/16 0618  BP: (!) 148/51  Pulse: 66  Resp: 18  Temp: 36.9 C    Last Pain:  Vitals:   07/23/16 0618  TempSrc: Oral         Complications: No apparent anesthesia complications

## 2016-07-23 NOTE — Anesthesia Procedure Notes (Signed)
Spinal  Patient location during procedure: OR Start time: 07/23/2016 7:30 AM End time: 07/23/2016 7:40 AM Staffing Resident/CRNA: Nelda Marseille Performed: resident/CRNA  Preanesthetic Checklist Completed: patient identified, site marked, surgical consent, pre-op evaluation, timeout performed, IV checked, risks and benefits discussed and monitors and equipment checked Spinal Block Patient position: sitting Prep: Betadine Patient monitoring: heart rate, continuous pulse ox, blood pressure and cardiac monitor Approach: midline Location: L4-5 Injection technique: single-shot Needle Needle type: Quincke  Needle gauge: 22 G Needle length: 9 cm Assessment Sensory level: T10 Additional Notes Negative paresthesia. Negative blood return. Positive free-flowing CSF. Expiration date of kit checked and confirmed. Patient tolerated procedure well, without complications.

## 2016-07-23 NOTE — NC FL2 (Signed)
[place] LEVEL OF CARE SCREENING TOOL     IDENTIFICATION  Patient Name: Abigail Horne Birthdate: 01-04-1946 Sex: female Admission Date (Current Location): 07/23/2016  [place] and [place] Number:  Engineering geologist and Address:  [facility], [address] [place], [place] [address]      Provider Number: [id number]  Attending Physician Name and Address:  Hessie Knows, MD  Relative Name and Phone Number:       Current Level of Care: Hospital Recommended Level of Care: [facility] Prior Approval Number:    Date Approved/Denied:   PASRR Number:  ([id number])  Discharge Plan: SNF    Current Diagnoses: Patient Active Problem List   Diagnosis Date Noted  . Primary localized osteoarthritis of left knee 07/23/2016  . Vaginal pessary in situ 02/08/2016  . Mixed stress and urge urinary incontinence 09/05/2015  . Recurrent UTI 06/26/2015  . Cystocele, midline 06/26/2015  . Anxiety 05/17/2015  . Essential (primary) hypertension 05/17/2015  . Gastro-esophageal reflux disease without esophagitis 05/17/2015  . Extreme obesity ([place]) 05/17/2015  . Morbid obesity ([place]) 05/17/2015  . UTI symptoms 05/06/2015  . Atrophic vaginitis 05/06/2015  . Cystocele, grade 2 05/06/2015    Orientation RESPIRATION BLADDER Height & Weight     Self, Time, Situation, Place  O2 (2 Liters Oxygen) Continent Weight: 256 lb 3.2 oz (116.2 kg) Height:     BEHAVIORAL SYMPTOMS/MOOD NEUROLOGICAL BOWEL NUTRITION STATUS   (none)  (none) Continent Diet (Diet: Clear Liquid )  AMBULATORY STATUS COMMUNICATION OF NEEDS Skin   Extensive Assist Verbally Surgical wounds (Incision in Left Knee)                       Personal Care Assistance Level of Assistance  Bathing, Feeding, Dressing Bathing Assistance: Limited assistance Feeding assistance: Independent Dressing Assistance: Limited assistance     Functional Limitations Info  Sight, Hearing,  Speech Sight Info: Adequate Hearing Info: Adequate Speech Info: Adequate    SPECIAL CARE FACTORS FREQUENCY  PT (By licensed PT), OT (By licensed OT)     PT Frequency:  (5) OT Frequency:  (5)            Contractures      Additional Factors Info  Code Status, Allergies Code Status Info:  (Full Code) Allergies Info:  (Contrast Media Iodinated Diagnostic Agents)           Current Medications (07/23/2016):  This is the current hospital active medication list Current Facility-Administered Medications  Medication Dose Route Frequency Provider Last Rate Last Dose  . 0.9 %  sodium chloride infusion   Intravenous Continuous Hessie Knows, MD 75 mL/hr at 07/23/16 1155    . acetaminophen (TYLENOL) tablet 650 mg  650 mg Oral Q6H PRN Hessie Knows, MD       Or  . acetaminophen (TYLENOL) suppository 650 mg  650 mg Rectal Q6H PRN Hessie Knows, MD      . bisacodyl (DULCOLAX) suppository 10 mg  10 mg Rectal Daily PRN Hessie Knows, MD      . ceFAZolin (ANCEF) IVPB 2g/100 mL premix  2 g Intravenous Q6H Hessie Knows, MD      . diphenhydrAMINE (BENADRYL) 12.5 MG/5ML elixir 12.5-25 mg  12.5-25 mg Oral Q4H PRN Hessie Knows, MD      . docusate sodium (COLACE) capsule 100 mg  100 mg Oral BID Hessie Knows, MD      . Derrill Memo ON 07/24/2016] enoxaparin (LOVENOX) injection 40 mg  40 mg Subcutaneous Q12H Hessie Knows, MD      . estradiol (ESTRACE) vaginal cream 1 Applicatorful  1 Applicatorful Vaginal QHS Hessie Knows, MD      . fentaNYL (SUBLIMAZE) 100 MCG/2ML injection           . fesoterodine (TOVIAZ) tablet 4 mg  4 mg Oral Daily Hessie Knows, MD      . furosemide (LASIX) tablet 20 mg  20 mg Oral Daily Hessie Knows, MD      . hydrochlorothiazide (HYDRODIURIL) tablet 25 mg  25 mg Oral Daily Hessie Knows, MD      . lisinopril (PRINIVIL,ZESTRIL) tablet 20 mg  20 mg Oral Daily Hessie Knows, MD      . magnesium citrate solution 1 Bottle  1 Bottle Oral Once PRN Hessie Knows, MD      . magnesium hydroxide (MILK  OF MAGNESIA) suspension 30 mL  30 mL Oral Daily PRN Hessie Knows, MD      . menthol-cetylpyridinium (CEPACOL) lozenge 3 mg  1 lozenge Oral PRN Hessie Knows, MD       Or  . phenol (CHLORASEPTIC) mouth spray 1 spray  1 spray Mouth/Throat PRN Hessie Knows, MD      . methocarbamol (ROBAXIN) tablet 500 mg  500 mg Oral Q6H PRN Hessie Knows, MD       Or  . methocarbamol (ROBAXIN) 500 mg in dextrose 5 % 50 mL IVPB  500 mg Intravenous Q6H PRN Hessie Knows, MD      . metoCLOPramide (REGLAN) tablet 5-10 mg  5-10 mg Oral Q8H PRN Hessie Knows, MD       Or  . metoCLOPramide (REGLAN) injection 5-10 mg  5-10 mg Intravenous Q8H PRN Hessie Knows, MD      . metoprolol succinate (TOPROL-XL) 24 hr tablet 50 mg  50 mg Oral Daily Hessie Knows, MD      . morphine 2 MG/ML injection 2 mg  2 mg Intravenous Q1H PRN Hessie Knows, MD   2 mg at 07/23/16 1254  . morphine 2 MG/ML injection           . multivitamin-lutein (OCUVITE-LUTEIN) capsule 1 capsule  1 capsule Oral Daily Hessie Knows, MD      . ondansetron [facility]) tablet 4 mg  4 mg Oral Q6H PRN Hessie Knows, MD       Or  . ondansetron [facility]) injection 4 mg  4 mg Intravenous Q6H PRN Hessie Knows, MD      . oxyCODONE (Oxy IR/ROXICODONE) immediate release tablet 5-10 mg  5-10 mg Oral Q3H PRN Hessie Knows, MD   5 mg at 07/23/16 1414  . pantoprazole (PROTONIX) EC tablet 40 mg  40 mg Oral BID Hessie Knows, MD      . sertraline (ZOLOFT) tablet 100 mg  100 mg Oral Daily Hessie Knows, MD      . sodium chloride (OCEAN) 0.65 % nasal spray 1 spray  1 spray Each Nare Daily PRN Hessie Knows, MD      . zolpidem [facility]) tablet 5 mg  5 mg Oral QHS PRN Hessie Knows, MD         Discharge Medications: Please see discharge summary for a list of discharge medications.  Relevant Imaging Results:  Relevant Lab Results:   Additional Information  (SSN: [id number])  Fynley Chrystal, Veronia Beets, LCSW

## 2016-07-23 NOTE — Op Note (Signed)
07/23/2016  9:39 AM  PATIENT:  Abigail Horne  70 y.o. female  PRE-OPERATIVE DIAGNOSIS:  osteoarthritis left knee  POST-OPERATIVE DIAGNOSIS:  osteoarthritis left knee  PROCEDURE:  Procedure(s): TOTAL KNEE ARTHROPLASTY (Left)  SURGEON: Laurene Footman, MD  ASSISTANTS: Rachelle Hora [facility]  ANESTHESIA:   spinal  EBL:  Total I/O In: 1100 [I.V.:1100] Out: 300 [Urine:200; Blood:100]  BLOOD ADMINISTERED:none  DRAINS: none   LOCAL MEDICATIONS USED:  MARCAINE    and OTHER exparel, and morphine  SPECIMEN:  No Specimen  DISPOSITION OF SPECIMEN:  N/A  COUNTS:  YES  TOURNIQUET:   64 minutes at 300 mmHg  IMPLANTS: Medacta GMK sphere system with 3+ femur, 3 tibia with 10 mm insert with a short stem, 2 patella, all components cemented  DICTATION: .Dragon Dictation patient was brought to the operating room and after adequate anesthesia was obtained the left leg was prepped and draped in usual sterile fashion. After patient identification and timeout procedure were completed tourniquet was raised and a midline skin incision was made followed by medial parapatellar arthrotomy. Inspection revealed eburnated bone in the medial compartment with the medial compartment having no cartilage advanced patellofemoral degenerative changes with exposed bone over most of the patella and within the trochlear groove and moderate osteoarthritis of the lateral compartment with some cartilage remaining. The anterior cruciate ligament and PCL were excised along with the fat pad and the proximal tibia was exposed to allow for application the proximal tibia cutting guide from the my knee system. Proximal tibia cut was carried out with the bone removed followed by the femoral cut in a similar fashion the 3+ femur cutting guide was applied anterior posterior chamfer cuts were carried out. With the menisci more visible after resection of the femur and the posterior horns were excised and the proximal tibia prepared with  proximal drilling and hand reaming for 6 short stem. The keel punch was then placed followed by the 3+ femur and a 10 mm insert gave good stability. The distal femoral drill holes were made and the trochlear groove cut was then carried out. Next the trials were removed and the patella cut using the patellar cutting guide and it measured a size 2 after drilling holes were made. After letting the tourniquet down the periarticular injection was given with the above medications. After this was completed the tourniquet was raised up there is no significant bleeding the wound was thoroughly irrigated and dried with the bony surfaces dried and the all components then cemented in place with the knee held in extension. After the cement had set excess cement was removed using an osteotome and the knee again thoroughly irrigated with Betadine dilute Betadine solution followed by pulsatile irrigation. After this the arthrotomy was repaired using a heavy Quill suture, followed by2-0 Quill subcutaneously and skin staples. A Provena wound VAC was applied to the incision secondary to amount of subcutaneous tissue to aid in healing. Ace wrap was applied over this followed by the [place]: Admit to inpatient   PATIENT DISPOSITION:  PACU - hemodynamically stable.

## 2016-07-24 ENCOUNTER — Encounter: Payer: Self-pay | Admitting: Orthopedic Surgery

## 2016-07-24 LAB — BASIC METABOLIC PANEL
Anion gap: 3 — ABNORMAL LOW (ref 5–15)
BUN: 16 mg/dL (ref 6–20)
CHLORIDE: 107 mmol/L (ref 101–111)
CO2: 30 mmol/L (ref 22–32)
CREATININE: 1.25 mg/dL — AB (ref 0.44–1.00)
Calcium: 8.4 mg/dL — ABNORMAL LOW (ref 8.9–10.3)
GFR calc Af Amer: 49 mL/min — ABNORMAL LOW (ref >60)
GFR calc non Af Amer: 43 mL/min — ABNORMAL LOW (ref >60)
Glucose, Bld: 106 mg/dL — ABNORMAL HIGH (ref 65–99)
POTASSIUM: 5 mmol/L (ref 3.5–5.1)
Sodium: 140 mmol/L (ref 135–145)

## 2016-07-24 LAB — CBC
HEMATOCRIT: 32.7 % — AB (ref 35.0–47.0)
HEMOGLOBIN: 11.1 g/dL — AB (ref 12.0–16.0)
MCH: 31.6 pg (ref 26.0–34.0)
MCHC: 33.9 g/dL (ref 32.0–36.0)
MCV: 93.4 fL (ref 80.0–100.0)
Platelets: 108 10*3/uL — ABNORMAL LOW (ref 150–440)
RBC: 3.51 MIL/uL — AB (ref 3.80–5.20)
RDW: 14.3 % (ref 11.5–14.5)
WBC: 7.3 10*3/uL (ref 3.6–11.0)

## 2016-07-24 NOTE — Care Management (Signed)
Lovenox co pay is $85 dollars and Co pay for home health services is $25 per visit. Patient stated that she will be able to pay for this.

## 2016-07-24 NOTE — Progress Notes (Signed)
Physical Therapy Treatment Patient Details Name: Abigail Horne MRN: [id number] DOB: [birth year] Today's Date: 07/24/2016    History of Present Illness Pt. is a 70 y.o. female who was admitted for a Left TKR.    PT Comments    Pt presented in bed agreeable to therapy requesting to use toilet.  Pt was modA supine to sit with cues for sequencing. Pt performed sit to stand with modA requiring x 3 attempts and elevating bed. Upon standing pt encouraged to increase wt bearing on LLE. Pt able to take several small steps to commode however decreased foot clearance and wt shift to L.  Pt cued for proper hand placement with transfers and improved throughout session.  Pt performed sit to stand from [facility] with modA x1 attempt and able to take side steps and backwards steps to recliner chair.  Pt left in recliner with heel prop and all needs met.   Follow Up Recommendations  SNF     Equipment Recommendations  Rolling walker with 5" wheels    Recommendations for Other Services       Precautions / Restrictions Precautions Precautions: Fall Restrictions Weight Bearing Restrictions: Yes LLE Weight Bearing: Weight bearing as tolerated    Mobility  Bed Mobility Overal bed mobility: Needs Assistance Bed Mobility: Supine to Sit     Supine to sit: Min assist     General bed mobility comments: Cues for sequencing  Transfers Overall transfer level: Needs assistance Equipment used: Rolling walker (2 wheeled) Transfers: Sit to/from Stand Sit to Stand: Mod assist         General transfer comment: Cues for sequencing and hand placement, required x 2 attempts   Ambulation/Gait Ambulation/Gait assistance: Min assist Ambulation Distance (Feet): 3 Feet Assistive device: Rolling walker (2 wheeled) Gait Pattern/deviations: Step-to pattern;Decreased step length - left;Decreased stance time - left;Decreased weight shift to left;Antalgic     General Gait Details: Decreased L foot  clearance   Stairs            Wheelchair Mobility    Modified Rankin (Stroke Patients Only)       Balance Overall balance assessment: Needs assistance Sitting-balance support: Feet supported Sitting balance-Leahy Scale: Good     Standing balance support: Bilateral upper extremity supported Standing balance-Leahy Scale: Fair                      Cognition Arousal/Alertness: Awake/alert Behavior During Therapy: WFL for tasks assessed/performed Overall Cognitive Status: Within Functional Limits for tasks assessed                      Exercises      General Comments        Pertinent Vitals/Pain Pain Assessment: 0-10 Pain Score: 0-No pain Pain Descriptors / Indicators: Aching;Sore Pain Intervention(s): Limited activity within patient's tolerance    Home Living Family/patient expects to be discharged to:: Private residence Living Arrangements: Alone Available Help at Discharge: Family;Available 24 hours/day Type of Home: House Home Access: Ramped entrance   Home Layout: One level Home Equipment: Shower seat - built in      Prior Function Level of Independence: Independent with assistive device(s)          PT Goals (current goals can now be found in the care plan section) Acute Rehab PT Goals Patient Stated Goal: To return home    Frequency  BID    PT Plan Current plan remains appropriate    Co-evaluation  End of Session Equipment Utilized During Treatment: Gait belt;Oxygen Activity Tolerance: Patient limited by pain Patient left: in chair;with call bell/phone within reach;with chair alarm set;with family/visitor present     Time: 0925-1005 PT Time Calculation (min) (ACUTE ONLY): 40 min  Charges:  $Therapeutic Activity: 38-52 mins                    G Codes:      Paolina Karwowski August 05, 2016, 12:37 PM  Jessilynn [place], PTA

## 2016-07-24 NOTE — Care Management Note (Addendum)
Case Management Note  Patient Details  Name: Abigail Horne MRN: [id number] Date of Birth: 1946-05-07  Subjective/Objective:     Spoke with patient for discharge planning. Patient is alert and oriented from home. She stated that her Abigail Horne is living with her at this time and will be able to assist her. Patient stated that she has a rolling walker, BSC, and elevated toilet seat. She also has a wlk in shower. She uses [facility] in [place] as her pharmacy.  Lovenox 40mg  injection once daily for 14 days no refills called to [phone] PT is recommending SNF at this time but patient stated that she does not want to go to Rehab.               Action/Plan: Home with home health at this time.  Expected Discharge Date:                  Expected Discharge Plan:  [place]  In-House Referral:     Discharge planning Services  CM Consult  Post Acute Care Choice:    Choice offered to:  Patient  DME Arranged:    DME Agency:  [place]:  PT [facility] Agency:  [place]  Status of Service:  In process, will continue to follow  If discussed at Long Length of Stay Meetings, dates discussed:    Additional Comments: Lovenox called to [employer]. Awaiting co pay to be called back.  Alvie Heidelberg, RN 07/24/2016, 11:06 AM

## 2016-07-24 NOTE — Clinical Social Work Note (Signed)
Clinical Social Work Assessment  Patient Details  Name: Abigail Horne MRN: [id number] Date of Birth: 04/07/1946  Date of referral:  07/24/16               Reason for consult:  Facility Placement                Permission sought to share information with:  Chartered certified accountant granted to share information::  Yes, Verbal Permission Granted  Name::      [place]::   [place]   Relationship::     Contact Information:     Housing/Transportation Living arrangements for the past 2 months:  [place] of Information:  Patient Patient Interpreter Needed:  None Criminal Activity/Legal Involvement Pertinent to Current Situation/Hospitalization:  No - Comment as needed Significant Relationships:  Adult Children, Siblings, Other Family Members Lives with:  Self Do you feel safe going back to the place where you live?  Yes Need for family participation in patient care:  Yes (Comment)  Care giving concerns:  Patient lives alone in [place] however her cousin Abigail Horne is coming to stay with her if she returns home after the hospital stay.    Social Worker assessment / plan:  Holiday representative (CSW) received a SNF consult. PT is recommending SNF. CSW met with patient to discuss D/C plan. Patient was alert and oriented and was sitting up in the chair. Patient reported that she lives alone in [place] and is having her cousin come stay with her for a little while to help her recover from surgery. CSW explained that PT is recommending SNF. Patient reported that she has a good set up at home and has 3 wheel chair ramps and all the equipment she needs. Patient reported that she prefers to go home. Patient is agreeable to SNF search as a back up plan.   FL2 complete and faxed out. CSW will continue to follow and assist as needed.   Employment status:  Retired Nurse, adult PT Recommendations:  [place] / Referral to community resources:  [place]  Patient/Family's Response to care:  Patient is hoping to go home however she is agreeable to [employer].   Patient/Family's Understanding of and Emotional Response to Diagnosis, Current Treatment, and Prognosis:  Patient was pleasant and thanked CSW for visit.   Emotional Assessment Appearance:  Appears stated age Attitude/Demeanor/Rapport:    Affect (typically observed):  Accepting, Adaptable, Pleasant Orientation:  Oriented to Self, Oriented to Place, Oriented to  Time, Oriented to Situation Alcohol / Substance use:  Not Applicable Psych involvement (Current and /or in the community):  No (Comment)  Discharge Needs  Concerns to be addressed:  Discharge Planning Concerns Readmission within the last 30 days:  No Current discharge risk:  Dependent with Mobility Barriers to Discharge:  Continued Medical Work up   [employer], Abigail Beets, LCSW 07/24/2016, 4:20 PM

## 2016-07-24 NOTE — Progress Notes (Signed)
   Subjective: 1 Day Post-Op Procedure(s) (LRB): TOTAL KNEE ARTHROPLASTY (Left) Patient reports pain as moderate.   Patient is well, and has had no acute complaints or problems Denies any CP, SOB, ABD pain. We will continue therapy today.    Objective: Vital signs in last 24 hours: Temp:  [97.1 F (36.2 C)-98.3 F (36.8 C)] 97.7 F (36.5 C) (08/30 0751) Pulse Rate:  [58-74] 64 (08/30 0751) Resp:  [8-20] 18 (08/30 0751) BP: (115-156)/(51-112) 133/57 (08/30 0751) SpO2:  [84 %-100 %] 97 % (08/30 0751) Weight:  [116.2 kg (256 lb 3.2 oz)] 116.2 kg (256 lb 3.2 oz) (08/29 1700)  Intake/Output from previous day: 08/29 0701 - 08/30 0700 In: 3410 [P.O.:760; I.V.:2450; IV Piggyback:200] Out: 2200 [Urine:2100; Blood:100] Intake/Output this shift: No intake/output data recorded.   Recent Labs  07/23/16 1241 07/24/16 0355  HGB 12.3 11.1*    Recent Labs  07/23/16 1241 07/24/16 0355  WBC 6.5 7.3  RBC 3.91 3.51*  HCT 36.2 32.7*  PLT 107* 108*    Recent Labs  07/23/16 1241 07/24/16 0355  NA  --  140  K  --  5.0  CL  --  107  CO2  --  30  BUN  --  16  CREATININE 1.10* 1.25*  GLUCOSE  --  106*  CALCIUM  --  8.4*   No results for input(s): LABPT, INR in the last 72 hours.  EXAM General - Patient is Alert, Appropriate and Oriented Extremity - Neurovascular intact Sensation intact distally Intact pulses distally Dorsiflexion/Plantar flexion intact No cellulitis present Compartment soft Dressing - dressing C/D/I and wound vac intact Motor Function - intact, moving foot and toes well on exam.   Past Medical History:  Diagnosis Date  . Anxiety   . Arthritis   . Atrophic vaginitis   . BIH (benign intracranial hypertension)   . Bladder calculus   . Bladder prolapse, female, acquired   . Cervix abnormality   . Chronic cystitis   . Cirrhosis ([place])   . Cystocele   . GERD (gastroesophageal reflux disease)   . Hx-TIA (transient ischemic attack)   . Hyperlipidemia    . Hypertension   . Liver disease   . Nocturia   . Over weight   . Seasonal allergies   . Stress incontinence   . Trigonitis   . Urethral caruncle   . Urethral stricture   . Urinary urgency     Assessment/Plan:   1 Day Post-Op Procedure(s) (LRB): TOTAL KNEE ARTHROPLASTY (Left) Active Problems:   Primary localized osteoarthritis of left knee   Acute post op blood loss anemia  Estimated body mass index is 46.86 kg/m as calculated from the following:   Height as of this encounter: 5\' 2"  (1.575 m).   Weight as of this encounter: 116.2 kg (256 lb 3.2 oz). Advance diet Up with therapy  Needs BM Recheck labs in the am CM to assist with Discharge  DVT Prophylaxis - Lovenox, Foot Pumps and TED hose Weight-Bearing as tolerated to left leg   T. Rachelle Hora, PA-C [place] 07/24/2016, 8:14 AM

## 2016-07-24 NOTE — Clinical Social Work Placement (Signed)
   CLINICAL SOCIAL WORK PLACEMENT  NOTE  Date:  07/24/2016  Patient Details  Name: Abigail Horne MRN: [id number] Date of Birth: 09/10/1946  Clinical Social Work is seeking post-discharge placement for this patient at the [place] level of care (*CSW will initial, date and re-position this form in  chart as items are completed):  Yes   Patient/family provided with [place] Work Department's list of facilities offering this level of care within the geographic area requested by the patient (or if unable, by the patient's family).  Yes   Patient/family informed of their freedom to choose among providers that offer the needed level of care, that participate in Medicare, Medicaid or managed care program needed by the patient, have an available bed and are willing to accept the patient.  Yes   Patient/family informed of [facility]'s ownership interest in [facility] and [facility], as well as of the fact that they are under no obligation to receive care at these facilities.  PASRR submitted to EDS on 07/24/16     PASRR number received on 07/24/16     Existing PASRR number confirmed on       FL2 transmitted to all facilities in geographic area requested by pt/family on 07/24/16     FL2 transmitted to all facilities within larger geographic area on       Patient informed that his/her managed care company has contracts with or will negotiate with certain facilities, including the following:            Patient/family informed of bed offers received.  Patient chooses bed at       Physician recommends and patient chooses bed at      Patient to be transferred to   on  .  Patient to be transferred to facility by       Patient family notified on   of transfer.  Name of family member notified:        PHYSICIAN       Additional Comment:    _______________________________________________ Eirik Schueler, Veronia Beets, LCSW 07/24/2016, 4:19 PM

## 2016-07-24 NOTE — Anesthesia Postprocedure Evaluation (Signed)
Anesthesia Post Note  Patient: Abigail Horne  Procedure(s) Performed: Procedure(s) (LRB): TOTAL KNEE ARTHROPLASTY (Left)  Patient location during evaluation: Nursing Unit Anesthesia Type: Spinal Level of consciousness: awake, awake and alert and oriented Pain management: pain level controlled Vital Signs Assessment: post-procedure vital signs reviewed and stable Respiratory status: spontaneous breathing, nonlabored ventilation and respiratory function stable Cardiovascular status: stable Postop Assessment: no headache Anesthetic complications: no    Last Vitals:  Vitals:   07/23/16 2325 07/24/16 0418  BP: (!) 135/55 (!) 120/54  Pulse: 66 65  Resp:  20  Temp:  36.7 C    Last Pain:  Vitals:   07/24/16 0655  TempSrc:   PainSc: Asleep                 Azan Maneri,  [facility]

## 2016-07-24 NOTE — Evaluation (Signed)
Occupational Therapy Evaluation Patient Details Name: Abigail Horne MRN: [id number] DOB: 1946/07/16 Today's Date: 07/24/2016    History of Present Illness Pt. is a 69 y.o. female who was admitted for a Left TKR.   Clinical Impression   Pt. Is a Left y.o. Female who was admitted for a left TKR. Pt presents with limited ROM, Pain, weakness, and impaired functional mobility which hinder her ability to complete ADL and IADL tasks. Pt. could benefit from skilled OT services to review A/E use for LE ADLs, to review necessary home modifications, and to improve functional mobility for ADL/IADLs in order to work towards regaining Independence with ADL/IADLs.     Follow Up Recommendations  Home health OT    Equipment Recommendations       Recommendations for Other Services PT consult     Precautions / Restrictions Precautions Precautions: Fall Restrictions Weight Bearing Restrictions: Yes LLE Weight Bearing: Weight bearing as tolerated                                                                      ADL Overall ADL's : Needs assistance/impaired Eating/Feeding: Set up   Grooming: Set up               Lower Body Dressing: Maximal assistance                 General ADL Comments: Pt. education was provided about A/E use for LE dressing.     Vision     Perception     Praxis      Pertinent Vitals/Pain Pain Assessment: 0-10 Pain Score: 0-No pain Pain Descriptors / Indicators: Aching;Sore Pain Intervention(s): Limited activity within patient's tolerance     Hand Dominance Right   Extremity/Trunk Assessment Upper Extremity Assessment Upper Extremity Assessment: Overall WFL for tasks assessed       Communication Communication Communication: No difficulties   Cognition Arousal/Alertness: Awake/alert Behavior During Therapy: WFL for tasks assessed/performed Overall Cognitive Status: Within Functional Limits for tasks  assessed                     General Comments       Exercises       Shoulder Instructions      Home Living Family/patient expects to be discharged to:: Private residence Living Arrangements: Alone Available Help at Discharge: Family;Available 24 hours/day Type of Home: House Home Access: Ramped entrance     Home Layout: One level     Bathroom Shower/Tub: Walk-in shower         Home Equipment: Shower seat - built in          Prior Functioning/Environment Level of Independence: Independent with assistive device(s)             OT Diagnosis: Generalized weakness;Acute pain   OT Problem List: Decreased strength;Decreased activity tolerance;Pain;Decreased knowledge of use of DME or AE   OT Treatment/Interventions: Self-care/ADL training;Therapeutic exercise;Therapeutic activities;DME and/or AE instruction;Patient/family education    OT Goals(Current goals can be found in the care plan section) Acute Rehab OT Goals Patient Stated Goal: To return home OT Goal Formulation: With patient Potential to Achieve Goals: Good  OT Frequency: Min 1X/week   Barriers to D/C:  Co-evaluation              End of Session    Activity Tolerance: Patient tolerated treatment well Patient left: in chair;with chair alarm set;with call bell/phone within reach;with family/visitor present   Time: 1000-1030 OT Time Calculation (min): 30 min Charges:  OT Evaluation $OT Eval Moderate Complexity: 1 Procedure OT Treatments $Self Care/Home Management : 8-22 mins G-Codes:    Harrel Carina 2016-07-28, 11:52 AM

## 2016-07-24 NOTE — Progress Notes (Signed)
Physical Therapy Treatment Patient Details Name: Abigail Horne MRN: [id number] DOB: Feb 04, 1946 Today's Date: 07/24/2016    History of Present Illness admitted for acute hospitalization status post L TKR, WBAT (8/28).    PT Comments    Pt presented in [facility] [place] sit to stand with RW.  Pt ambulated approx 23ft x 1 with RW and minA.  Cue for increased quad activation, increasing L foot clearance, and increasing L step length. Required x3 standing rest breaks 2/2 pain and fatigue.  Upon sitting pt able to tolerate seated therex as noted below with instruction on self ROM.  Pt very guarded with all movements, but improved tolerance to therapy this pm.   Follow Up Recommendations  SNF     Equipment Recommendations  Rolling walker with 5" wheels    Recommendations for Other Services       Precautions / Restrictions Precautions Precautions: Fall Restrictions Weight Bearing Restrictions: Yes LLE Weight Bearing: Weight bearing as tolerated    Mobility  Bed Mobility                  Transfers Overall transfer level: Needs assistance Equipment used: Rolling walker (2 wheeled) Transfers: Sit to/from Stand Sit to Stand: Mod assist         General transfer comment: Improved technique from am session   Ambulation/Gait Ambulation/Gait assistance: Min assist Ambulation Distance (Feet): 40 Feet Assistive device: Rolling walker (2 wheeled) Gait Pattern/deviations: Step-to pattern;Decreased step length - left;Decreased stance time - left;Decreased dorsiflexion - left;Decreased weight shift to left Gait velocity: self selected/decreased Gait velocity interpretation: Below normal speed for age/gender General Gait Details: decreased foot clearance, no buckling, x3 resting breaks 2/2 pain   Stairs            Wheelchair Mobility    Modified Rankin (Stroke Patients Only)       Balance Overall balance assessment: Needs assistance Sitting-balance support: Feet  supported Sitting balance-Leahy Scale: Good     Standing balance support: Bilateral upper extremity supported Standing balance-Leahy Scale: Fair                      Cognition Arousal/Alertness: Awake/alert Behavior During Therapy: WFL for tasks assessed/performed Overall Cognitive Status: Within Functional Limits for tasks assessed                      Exercises Total Joint Exercises Ankle Circles/Pumps: Both;10 reps;Seated Quad Sets: Strengthening;Both;10 reps;Seated Heel Slides: AAROM;Left;10 reps;Seated Hip ABduction/ADduction: AROM;Left;10 reps;Seated Straight Leg Raises: AAROM;Left;10 reps;Seated Long Arc Quad: AROM;Left;10 reps;Seated Knee Flexion: PROM;Left;5 reps;Seated    General Comments        Pertinent Vitals/Pain Pain Assessment: 0-10 Pain Score: 8  Pain Descriptors / Indicators: Aching;Operative site guarding Pain Intervention(s): Limited activity within patient's tolerance;Monitored during session    [place] expects to be discharged to:: Private residence Living Arrangements: Alone Available Help at Discharge: Family;Available 24 hours/day Type of Home: House Home Access: Ramped entrance   Home Layout: One level Home Equipment: Shower seat - built in      Prior Function Level of Independence: Independent with assistive device(s)          PT Goals (current goals can now be found in the care plan section) Acute Rehab PT Goals Patient Stated Goal: To return home    Frequency  BID    PT Plan Current plan remains appropriate    Co-evaluation  End of Session Equipment Utilized During Treatment: Gait belt Activity Tolerance: Patient tolerated treatment well;Patient limited by pain Patient left: in chair;with call bell/phone within reach;with chair alarm set;with family/visitor present     Time: 1325-1410 PT Time Calculation (min) (ACUTE ONLY): 45 min  Charges:  $Gait Training: 8-22  mins $Therapeutic Exercise: 8-22 mins $Therapeutic Activity: 8-22 mins                    G Codes:      Emoni Whitworth Aug 22, 2016, 2:19 PM  Kaytlynne Neace, PTA

## 2016-07-25 LAB — BASIC METABOLIC PANEL
ANION GAP: 0 — AB (ref 5–15)
BUN: 21 mg/dL — ABNORMAL HIGH (ref 6–20)
CALCIUM: 8.3 mg/dL — AB (ref 8.9–10.3)
CHLORIDE: 103 mmol/L (ref 101–111)
CO2: 35 mmol/L — AB (ref 22–32)
Creatinine, Ser: 1.27 mg/dL — ABNORMAL HIGH (ref 0.44–1.00)
GFR calc non Af Amer: 42 mL/min — ABNORMAL LOW (ref >60)
GFR, EST AFRICAN AMERICAN: 48 mL/min — AB (ref >60)
Glucose, Bld: 116 mg/dL — ABNORMAL HIGH (ref 65–99)
Potassium: 3.5 mmol/L (ref 3.5–5.1)
SODIUM: 138 mmol/L (ref 135–145)

## 2016-07-25 LAB — CBC
HEMATOCRIT: 30.9 % — AB (ref 35.0–47.0)
HEMOGLOBIN: 10.5 g/dL — AB (ref 12.0–16.0)
MCH: 31.6 pg (ref 26.0–34.0)
MCHC: 34 g/dL (ref 32.0–36.0)
MCV: 92.8 fL (ref 80.0–100.0)
Platelets: 98 10*3/uL — ABNORMAL LOW (ref 150–440)
RBC: 3.33 MIL/uL — ABNORMAL LOW (ref 3.80–5.20)
RDW: 14.5 % (ref 11.5–14.5)
WBC: 6.3 10*3/uL (ref 3.6–11.0)

## 2016-07-25 NOTE — Progress Notes (Signed)
   Subjective: 2 Days Post-Op Procedure(s) (LRB): TOTAL KNEE ARTHROPLASTY (Left) Patient reports pain as mild with rest and 8/10 with walking.   Patient is well, and has had no acute complaints or problems Denies any CP, SOB, ABD pain. We will continue therapy today.    Objective: Vital signs in last 24 hours: Temp:  [98 F (36.7 C)-98.6 F (37 C)] 98.4 F (36.9 C) (08/31 0747) Pulse Rate:  [64-73] 64 (08/31 0747) Resp:  [16-18] 18 (08/31 0747) BP: (106-150)/(49-60) 120/57 (08/31 0747) SpO2:  [85 %-98 %] 93 % (08/31 0747)  Intake/Output from previous day: 08/30 0701 - 08/31 0700 In: 1021.3 [P.O.:240; I.V.:781.3] Out: -  Intake/Output this shift: No intake/output data recorded.   Recent Labs  07/23/16 1241 07/24/16 0355 07/25/16 0447  HGB 12.3 11.1* 10.5*    Recent Labs  07/24/16 0355 07/25/16 0447  WBC 7.3 6.3  RBC 3.51* 3.33*  HCT 32.7* 30.9*  PLT 108* 98*    Recent Labs  07/24/16 0355 07/25/16 0447  NA 140 138  K 5.0 3.5  CL 107 103  CO2 30 35*  BUN 16 21*  CREATININE 1.25* 1.27*  GLUCOSE 106* 116*  CALCIUM 8.4* 8.3*   No results for input(s): LABPT, INR in the last 72 hours.  EXAM General - Patient is Alert, Appropriate and Oriented Extremity - Neurovascular intact Sensation intact distally Intact pulses distally Dorsiflexion/Plantar flexion intact No cellulitis present Compartment soft Dressing - dressing C/D/I and wound vac intact Motor Function - intact, moving foot and toes well on exam.   Past Medical History:  Diagnosis Date  . Anxiety   . Arthritis   . Atrophic vaginitis   . BIH (benign intracranial hypertension)   . Bladder calculus   . Bladder prolapse, female, acquired   . Cervix abnormality   . Chronic cystitis   . Cirrhosis ([place])   . Cystocele   . GERD (gastroesophageal reflux disease)   . Hx-TIA (transient ischemic attack)   . Hyperlipidemia   . Hypertension   . Liver disease   . Nocturia   . Over weight   .  Seasonal allergies   . Stress incontinence   . Trigonitis   . Urethral caruncle   . Urethral stricture   . Urinary urgency     Assessment/Plan:   2 Days Post-Op Procedure(s) (LRB): TOTAL KNEE ARTHROPLASTY (Left) Active Problems:   Primary localized osteoarthritis of left knee   Acute post op blood loss anemia  Estimated body mass index is 46.86 kg/m as calculated from the following:   Height as of this encounter: 5\' 2"  (1.575 m).   Weight as of this encounter: 116.2 kg (256 lb 3.2 oz). Advance diet Up with therapy  Needs BM CM to assist with Discharge, SNF Friday  DVT Prophylaxis - Lovenox, Foot Pumps and TED hose Weight-Bearing as tolerated to left leg   T. Rachelle Hora, PA-C [place] 07/25/2016, 8:07 AM

## 2016-07-25 NOTE — Progress Notes (Signed)
Clinical Education officer, museum (CSW) met with patient to present bed offers. [facility] chose [facility]. [facility] liaison is aware of accepted bed offer. [facility] Team case manager is aware of above. CSW will continue to follow and assist as needed.   [employer], LCSW [phone]

## 2016-07-25 NOTE — Progress Notes (Signed)
Physical Therapy Treatment Patient Details Name: Abigail Horne MRN: [id number] DOB: 1946-07-23 Today's Date: 07/25/2016    History of Present Illness admitted for acute hospitalization status post L TKR, WBAT (8/28).    PT Comments    Pt presented in [facility] agreeable to therapy.  Applied KI, pt performed sit to stand with min/modA.  As pt attempted first step pt with c/o sharp stabbing pain with pt leaning over RW.  Pt unable to move leg or reposition self to transfer to chair. Pt transferred to chair with +2/dependent transfer 2/2 pain.  Nsg present in room providing pain meds. Pt stating some relief once chair in reclined position however sensitive to touch, no redness noted once ace bandage removed. Pt able to tolerate x10 quad sets however unable to tolerate any further activities 2/2 pain. Pt left in chair with all current needs met.   Follow Up Recommendations  SNF     Equipment Recommendations  Rolling walker with 5" wheels    Recommendations for Other Services       Precautions / Restrictions Precautions Precautions: Fall Restrictions Weight Bearing Restrictions: Yes LLE Weight Bearing: Weight bearing as tolerated    Mobility  Bed Mobility                  Transfers Overall transfer level: Needs assistance Equipment used: Rolling walker (2 wheeled) Transfers: Sit to/from Stand Sit to Stand: Mod assist         General transfer comment: Continue to provide cues for hand placement   Ambulation/Gait             General Gait Details: unable 2/2 pain   Stairs            Wheelchair Mobility    Modified Rankin (Stroke Patients Only)       Balance Overall balance assessment: Needs assistance Sitting-balance support: Feet supported Sitting balance-Leahy Scale: Good     Standing balance support: Bilateral upper extremity supported Standing balance-Leahy Scale: Fair                      Cognition Arousal/Alertness:  Awake/alert Behavior During Therapy: WFL for tasks assessed/performed Overall Cognitive Status: Within Functional Limits for tasks assessed                      Exercises Total Joint Exercises Quad Sets: Strengthening;Both;10 reps    General Comments        Pertinent Vitals/Pain Pain Assessment: 0-10 Pain Score: 6  (10/10 with activity) Pain Descriptors / Indicators: Aching;Constant;Operative site guarding;Sore Pain Intervention(s): Limited activity within patient's tolerance    Home Living                      Prior Function            PT Goals (current goals can now be found in the care plan section) Acute Rehab PT Goals Patient Stated Goal: To return home    Frequency  BID    PT Plan Current plan remains appropriate    Co-evaluation             End of Session Equipment Utilized During Treatment: Gait belt;Left knee immobilizer Activity Tolerance: Patient limited by pain Patient left: in chair;with call bell/phone within reach;with chair alarm set;with family/visitor present     Time: 1350-1408 PT Time Calculation (min) (ACUTE ONLY): 18 min  Charges:  $Therapeutic Activity: 8-22 mins  G Codes:      Tyre Beaver 2016-08-15, 2:15 PM  Jeryn Cerney, PTA

## 2016-07-25 NOTE — Progress Notes (Signed)
Physical Therapy Treatment Patient Details Name: Abigail Horne MRN: [id number] DOB: Oct 15, 1946 Today's Date: 07/25/2016    History of Present Illness admitted for acute hospitalization status post L TKR, WBAT (8/28).    PT Comments    Pt presented in bed, stating decreased pain 3/10 at rest.  Performed HEP in supine x 10.  CGA supine to sit however required add'l time. Continues to require modA sit to stand however able to stand on first attempt. Pt attempted to initiate gait however c/o increased pain and stating that knee felt "as if it were to pop".  Pt sat in recliner and placed KI.  Pt resumed ambulation attempt with improved results and decreased pain. Discussed with pt continued recommendation to SNF and pt seemed to be in agreement.  Pt left in recliner with cryocuff on, heel prop in place, and all current needs met.   Follow Up Recommendations  SNF     Equipment Recommendations  Rolling walker with 5" wheels    Recommendations for Other Services       Precautions / Restrictions Precautions Precautions: Fall Restrictions Weight Bearing Restrictions: Yes LLE Weight Bearing: Weight bearing as tolerated    Mobility  Bed Mobility Overal bed mobility: Needs Assistance Bed Mobility: Supine to Sit     Supine to sit: Min guard     General bed mobility comments: Pt very guarded with movements, add'l time required  Transfers Overall transfer level: Needs assistance Equipment used: Rolling walker (2 wheeled) Transfers: Sit to/from Stand Sit to Stand: Mod assist         General transfer comment: Pt able to perform transfer on first attempt, cues for hand placement  Ambulation/Gait Ambulation/Gait assistance: Min assist Ambulation Distance (Feet): 60 Feet Assistive device: Rolling walker (2 wheeled) Gait Pattern/deviations: Step-to pattern;Decreased step length - left;Decreased stance time - left   Gait velocity interpretation: Below normal speed for  age/gender General Gait Details: Trial gait with KI, pt able to tolerate increased gait distance    Stairs            Wheelchair Mobility    Modified Rankin (Stroke Patients Only)       Balance Overall balance assessment: Needs assistance Sitting-balance support: Feet supported Sitting balance-Leahy Scale: Good     Standing balance support: Bilateral upper extremity supported Standing balance-Leahy Scale: Fair                      Cognition Arousal/Alertness: Awake/alert Behavior During Therapy: WFL for tasks assessed/performed Overall Cognitive Status: Within Functional Limits for tasks assessed                      Exercises Total Joint Exercises Ankle Circles/Pumps: Both;10 reps;Supine Quad Sets: Strengthening;Left;10 reps;Supine Heel Slides: AAROM;Left;10 reps;Supine Hip ABduction/ADduction: AROM;Left;10 reps;Supine Long Arc Quad: AROM;Left;10 reps;Seated Knee Flexion: PROM;Left;5 reps;Seated Goniometric ROM: 2-75    General Comments        Pertinent Vitals/Pain Pain Assessment: 0-10 Pain Score: 4  (at rest) Pain Descriptors / Indicators: Aching;Operative site guarding;Sore Pain Intervention(s): Limited activity within patient's tolerance;Premedicated before session    Home Living                      Prior Function            PT Goals (current goals can now be found in the care plan section) Acute Rehab PT Goals Patient Stated Goal: To return home    Frequency  BID    PT Plan Current plan remains appropriate    Co-evaluation             End of Session Equipment Utilized During Treatment: Gait belt;Left knee immobilizer Activity Tolerance: Patient limited by pain Patient left: in chair;with call bell/phone within reach;with chair alarm set     Time: 1093-2355 PT Time Calculation (min) (ACUTE ONLY): 58 min  Charges:  $Gait Training: 8-22 mins $Therapeutic Exercise: 8-22 mins $Therapeutic Activity:  23-37 mins                    G Codes:      Arnol Mcgibbon August 13, 2016, 12:21 PM  Mileena Rothenberger, PTA

## 2016-07-26 DIAGNOSIS — M25562 Pain in left knee: Secondary | ICD-10-CM | POA: Diagnosis not present

## 2016-07-26 DIAGNOSIS — E119 Type 2 diabetes mellitus without complications: Secondary | ICD-10-CM | POA: Diagnosis not present

## 2016-07-26 DIAGNOSIS — M6281 Muscle weakness (generalized): Secondary | ICD-10-CM | POA: Diagnosis not present

## 2016-07-26 DIAGNOSIS — I10 Essential (primary) hypertension: Secondary | ICD-10-CM | POA: Diagnosis not present

## 2016-07-26 DIAGNOSIS — Z299 Encounter for prophylactic measures, unspecified: Secondary | ICD-10-CM | POA: Diagnosis not present

## 2016-07-26 DIAGNOSIS — Z7401 Bed confinement status: Secondary | ICD-10-CM | POA: Diagnosis not present

## 2016-07-26 DIAGNOSIS — Z5189 Encounter for other specified aftercare: Secondary | ICD-10-CM | POA: Diagnosis not present

## 2016-07-26 DIAGNOSIS — E785 Hyperlipidemia, unspecified: Secondary | ICD-10-CM | POA: Diagnosis not present

## 2016-07-26 DIAGNOSIS — M1712 Unilateral primary osteoarthritis, left knee: Secondary | ICD-10-CM | POA: Diagnosis not present

## 2016-07-26 MED ORDER — ENOXAPARIN SODIUM 40 MG/0.4ML ~~LOC~~ SOLN: 40.0000 mg | SUBCUTANEOUS | 0 refills | Status: DC

## 2016-07-26 MED ORDER — TRAMADOL HCL 50 MG PO TABS: 50.0000 mg | ORAL_TABLET | Freq: Four times a day (QID) | ORAL | 0 refills | Status: DC | PRN

## 2016-07-26 MED ORDER — OXYCODONE HCL 5 MG PO TABS: 5.0000 mg | ORAL_TABLET | ORAL | 0 refills | Status: DC | PRN

## 2016-07-26 NOTE — Progress Notes (Signed)
Patient discharging to [facility], report called to kim at facility. EMS called. Waiting on transport.

## 2016-07-26 NOTE — Discharge Instructions (Signed)

## 2016-07-26 NOTE — Discharge Summary (Signed)
Physician Discharge Summary  Patient ID: Abigail Horne MRN: [id number] DOB/AGE: 1946/02/20 70 y.o.  Admit date: 07/23/2016 Discharge date: 07/26/2016  Admission Diagnoses:  osteoarthritis   Discharge Diagnoses: Patient Active Problem List   Diagnosis Date Noted  . Primary localized osteoarthritis of left knee 07/23/2016  . Vaginal pessary in situ 02/08/2016  . Mixed stress and urge urinary incontinence 09/05/2015  . Recurrent UTI 06/26/2015  . Cystocele, midline 06/26/2015  . Anxiety 05/17/2015  . Essential (primary) hypertension 05/17/2015  . Gastro-esophageal reflux disease without esophagitis 05/17/2015  . Extreme obesity ([place]) 05/17/2015  . Morbid obesity ([place]) 05/17/2015  . UTI symptoms 05/06/2015  . Atrophic vaginitis 05/06/2015  . Cystocele, grade 2 05/06/2015    Past Medical History:  Diagnosis Date  . Anxiety   . Arthritis   . Atrophic vaginitis   . BIH (benign intracranial hypertension)   . Bladder calculus   . Bladder prolapse, female, acquired   . Cervix abnormality   . Chronic cystitis   . Cirrhosis ([place])   . Cystocele   . GERD (gastroesophageal reflux disease)   . Hx-TIA (transient ischemic attack)   . Hyperlipidemia   . Hypertension   . Liver disease   . Nocturia   . Over weight   . Seasonal allergies   . Stress incontinence   . Trigonitis   . Urethral caruncle   . Urethral stricture   . Urinary urgency      Transfusion: none   Consultants (if any):   Discharged Condition: Improved  Hospital Course: Abigail Horne is an 70 y.o. female who was admitted 07/23/2016 with a diagnosis of <principal problem not specified> and went to the operating room on 07/23/2016 and underwent the above named procedures.    Surgeries: Procedure(s): TOTAL KNEE ARTHROPLASTY on 07/23/2016 Patient tolerated the surgery well. Taken to PACU where she was stabilized and then transferred to the orthopedic floor.  Started on Lovenox 40 q 12 hrs. Foot pumps applied  bilaterally at 80 mm. Heels elevated on bed with rolled towels. No evidence of DVT. Negative [place]oman. Physical therapy started on day #1 for gait training and transfer. OT started day #1 for ADL and assisted devices.  Patient's foley was d/c on day #1. Patient's IV was d/c on day #2.  On post op day #3 patient was stable and ready for discharge to SNF.  Implants: Medacta GMK sphere system with 3+ femur, 3 tibia with 10 mm insert with a short stem, 2 patella, all components cemented  She was given perioperative antibiotics:  Anti-infectives    Start     Dose/Rate Route Frequency Ordered Stop   07/23/16 1400  ceFAZolin (ANCEF) IVPB 2g/100 mL premix     2 g 200 mL/hr over 30 Minutes Intravenous Every 6 hours 07/23/16 1134 07/23/16 2155   07/23/16 0145  ceFAZolin (ANCEF) IVPB 2g/100 mL premix     2 g 200 mL/hr over 30 Minutes Intravenous  Once 07/23/16 0133 07/23/16 0800    .  She was given sequential compression devices, early ambulation, and lovenox for DVT prophylaxis.  She benefited maximally from the hospital stay and there were no complications.    Recent vital signs:  Vitals:   07/26/16 0351 07/26/16 0719  BP: (!) 106/54 131/76  Pulse: 68 75  Resp: 16 18  Temp: 98.3 F (36.8 C) 98.2 F (36.8 C)    Recent laboratory studies:  Lab Results  Component Value Date   HGB 10.5 (L) 07/25/2016  HGB 11.1 (L) 07/24/2016   HGB 12.3 07/23/2016   Lab Results  Component Value Date   WBC 6.3 07/25/2016   PLT 98 (L) 07/25/2016   Lab Results  Component Value Date   INR 1.04 07/10/2016   Lab Results  Component Value Date   NA 138 07/25/2016   K 3.5 07/25/2016   CL 103 07/25/2016   CO2 35 (H) 07/25/2016   BUN 21 (H) 07/25/2016   CREATININE 1.27 (H) 07/25/2016   GLUCOSE 116 (H) 07/25/2016    Discharge Medications:     Medication List    TAKE these medications   enoxaparin 40 MG/0.4ML injection Commonly known as:  LOVENOX Inject 0.4 mLs (40 mg total) into the skin  daily.   estradiol 0.1 MG/GM vaginal cream Commonly known as:  ESTRACE Place 1 Applicatorful vaginally at bedtime.   furosemide 20 MG tablet Commonly known as:  LASIX Take 20 mg by mouth daily.   lisinopril-hydrochlorothiazide 20-25 MG tablet Commonly known as:  PRINZIDE,ZESTORETIC Take 1 tablet by mouth daily.   meloxicam 15 MG tablet Commonly known as:  MOBIC Take 15 mg by mouth daily.   metoprolol succinate 50 MG 24 hr tablet Commonly known as:  TOPROL-XL Take 50 mg by mouth daily. Take with or immediately following a meal.   omeprazole 20 MG capsule Commonly known as:  PRILOSEC Take 20 mg by mouth daily.   oxyCODONE 5 MG immediate release tablet Commonly known as:  Oxy IR/ROXICODONE Take 1-2 tablets (5-10 mg total) by mouth every 3 (three) hours as needed for breakthrough pain.   PRESERVISION/LUTEIN PO Take 1 tablet by mouth daily.   sertraline 100 MG tablet Commonly known as:  ZOLOFT Take 100 mg by mouth daily.   sodium chloride 0.65 % Soln nasal spray Commonly known as:  OCEAN Place 1 spray into both nostrils daily as needed for congestion.   tolterodine 2 MG 24 hr capsule Commonly known as:  DETROL LA Take 1 capsule (2 mg total) by mouth daily.   traMADol 50 MG tablet Commonly known as:  ULTRAM Take 1 tablet (50 mg total) by mouth every 6 (six) hours as needed.       Diagnostic Studies: Dg Knee 1-2 Views Left  Result Date: 07/23/2016 CLINICAL DATA:  Status post total knee replacement EXAM: LEFT KNEE - 1-2 VIEW COMPARISON:  CT left knee June 18, 2016 FINDINGS: Frontal and lateral views obtained. The patient is status post total knee replacement with the femoral and tibial prosthetic components appearing well-seated. No fracture or dislocation evident. There remains subchondral cystic change along the medial aspect of the medial tibial plateau. Air within the joint is an expected postoperative finding. IMPRESSION: Prosthetic components appear well seated. No  acute fracture or dislocation. Subchondral cysts are noted in the medial aspect of the medial tibial plateau, not significantly changed. Electronically Signed   By: Lowella Grip III M.D.   On: 07/23/2016 10:13    Disposition: Final discharge disposition not confirmed    Contact information for after-discharge care    Destination    [facility] .   Specialty:  [place] information: [address] [place] [phone]               Signed: Feliberto Gottron 07/26/2016, 8:14 AM

## 2016-07-26 NOTE — Progress Notes (Signed)
   Subjective: 3 Days Post-Op Procedure(s) (LRB): TOTAL KNEE ARTHROPLASTY (Left) Patient reports pain as moderate.   Patient is well, and has had no acute complaints or problems Denies any CP, SOB, ABD pain. We will continue therapy today.     Objective: Vital signs in last 24 hours: Temp:  [98.1 F (36.7 C)-98.7 F (37.1 C)] 98.2 F (36.8 C) (09/01 0719) Pulse Rate:  [57-75] 75 (09/01 0719) Resp:  [16-18] 18 (09/01 0719) BP: (101-131)/(54-76) 131/76 (09/01 0719) SpO2:  [88 %-96 %] 95 % (09/01 0719)  Intake/Output from previous day: 08/31 0701 - 09/01 0700 In: 480 [P.O.:480] Out: 0  Intake/Output this shift: No intake/output data recorded.   Recent Labs  07/23/16 1241 07/24/16 0355 07/25/16 0447  HGB 12.3 11.1* 10.5*    Recent Labs  07/24/16 0355 07/25/16 0447  WBC 7.3 6.3  RBC 3.51* 3.33*  HCT 32.7* 30.9*  PLT 108* 98*    Recent Labs  07/24/16 0355 07/25/16 0447  NA 140 138  K 5.0 3.5  CL 107 103  CO2 30 35*  BUN 16 21*  CREATININE 1.25* 1.27*  GLUCOSE 106* 116*  CALCIUM 8.4* 8.3*   No results for input(s): LABPT, INR in the last 72 hours.  EXAM General - Patient is Alert, Appropriate and Oriented Extremity - Neurovascular intact Sensation intact distally Intact pulses distally Dorsiflexion/Plantar flexion intact No cellulitis present Compartment soft Dressing - dressing C/D/I and wound vac intact, no leakage or drainage Motor Function - intact, moving foot and toes well on exam.   Past Medical History:  Diagnosis Date  . Anxiety   . Arthritis   . Atrophic vaginitis   . BIH (benign intracranial hypertension)   . Bladder calculus   . Bladder prolapse, female, acquired   . Cervix abnormality   . Chronic cystitis   . Cirrhosis ([place])   . Cystocele   . GERD (gastroesophageal reflux disease)   . Hx-TIA (transient ischemic attack)   . Hyperlipidemia   . Hypertension   . Liver disease   . Nocturia   . Over weight   . Seasonal  allergies   . Stress incontinence   . Trigonitis   . Urethral caruncle   . Urethral stricture   . Urinary urgency     Assessment/Plan:   3 Days Post-Op Procedure(s) (LRB): TOTAL KNEE ARTHROPLASTY (Left) Active Problems:   Primary localized osteoarthritis of left knee   Acute post op blood loss anemia  Estimated body mass index is 46.86 kg/m as calculated from the following:   Height as of this encounter: 5\' 2"  (1.575 m).   Weight as of this encounter: 116.2 kg (256 lb 3.2 oz). Advance diet Up with therapy  Discharge to SNF today Follow up with [facility] ortho in 2 weeks for staple removal Remove wound vac 07/30/16, apply sterile dressing TED hose during day, remove at night time   DVT Prophylaxis - Lovenox, Foot Pumps and TED hose Weight-Bearing as tolerated to left leg   T. Rachelle Hora, PA-C [place] 07/26/2016, 8:08 AM

## 2016-07-26 NOTE — Care Management Important Message (Signed)
Important Message  Patient Details  Name: Abigail Horne MRN: [id number] Date of Birth: 05-Dec-1945   Medicare Important Message Given:  Yes    Alvie Heidelberg, RN 07/26/2016, 8:01 AM

## 2016-07-26 NOTE — Progress Notes (Signed)
Physical Therapy Treatment Patient Details Name: Abigail Horne MRN: [id number] DOB: 1946-03-23 Today's Date: 07/26/2016    History of Present Illness admitted for acute hospitalization status post L TKR, WBAT (8/28).    PT Comments    Patient remains very slow and guarded with all functional mobility, though able to increase gait distance (20' x2) and complete all mobility with less assist than required during previous session. Maintains L LE in generalized extension with all movement transitions and mobility; limited tolerance for knee flexion due to pain. KI not required today; demonstrating fair/good quad control with all [place] activities.   Follow Up Recommendations  SNF     Equipment Recommendations  Rolling walker with 5" wheels    Recommendations for Other Services       Precautions / Restrictions Precautions Precautions: Fall Restrictions Weight Bearing Restrictions: Yes LLE Weight Bearing: Weight bearing as tolerated    Mobility  Bed Mobility Overal bed mobility: Needs Assistance Bed Mobility: Supine to Sit     Supine to sit: Supervision     General bed mobility comments: heavy use of UEs and bedrails to complete; able to negotiate L LE over edge of bed without assist this date  Transfers Overall transfer level: Needs assistance Equipment used: Rolling walker (2 wheeled) Transfers: Sit to/from Stand Sit to Stand: Min assist         General transfer comment: cuing for hand placement; L LE anterior to BOS with all movement transitions.  Remains very guarded with decreased movement quality, decreased active use of L LE  Ambulation/Gait Ambulation/Gait assistance: Min assist Ambulation Distance (Feet): 20 Feet (x2) Assistive device: Rolling walker (2 wheeled)       General Gait Details: step to gait pattern with fair stance time/weight shift to L LE; no buckling noted.  Very slow and guarded; maintains L LE in extension throughout gait cycle with limited  heel strike/toe off.  Distance limited by pain.   Stairs            Wheelchair Mobility    Modified Rankin (Stroke Patients Only)       Balance Overall balance assessment: Needs assistance Sitting-balance support: No upper extremity supported;Feet supported Sitting balance-Leahy Scale: Good     Standing balance support: Bilateral upper extremity supported Standing balance-Leahy Scale: Fair                      Cognition Arousal/Alertness: Awake/alert Behavior During Therapy: WFL for tasks assessed/performed Overall Cognitive Status: Within Functional Limits for tasks assessed                      Exercises Total Joint Exercises Goniometric ROM: 0-76 Other Exercises Other Exercises: Sit/stand from edge of bed and from recliner with RW, min assist; standing balance during clothing management/hygiene with incontinent bladder episode, cga/close sup with max assist for hygiene and clothing.  Maintains majority of weight in R LE with all static standing activities    General Comments        Pertinent Vitals/Pain Pain Assessment: Faces Pain Score: 6  Pain Location: L knee Pain Descriptors / Indicators: Spasm;Sore;Aching    Home Living                      Prior Function            PT Goals (current goals can now be found in the care plan section) Acute Rehab PT Goals Patient Stated Goal: To  return home PT Goal Formulation: With patient Time For Goal Achievement: 08/06/16 Potential to Achieve Goals: Good Progress towards PT goals: Progressing toward goals    Frequency  BID    PT Plan Current plan remains appropriate    Co-evaluation             End of Session Equipment Utilized During Treatment: Gait belt Activity Tolerance: Patient tolerated treatment well Patient left: in chair;with call bell/phone within reach;with chair alarm set     Time: [id number] PT Time Calculation (min) (ACUTE ONLY): 34 min  Charges:   $Gait Training: 8-22 mins $Therapeutic Activity: 8-22 mins                    G Codes:      Rumor Sun H. Owens Shark, PT, DPT, NCS 07/26/16, 9:40 AM [phone]

## 2016-07-29 DIAGNOSIS — K219 Gastro-esophageal reflux disease without esophagitis: Secondary | ICD-10-CM | POA: Diagnosis not present

## 2016-07-29 DIAGNOSIS — S82009A Unspecified fracture of unspecified patella, initial encounter for closed fracture: Secondary | ICD-10-CM | POA: Diagnosis not present

## 2016-07-29 DIAGNOSIS — F419 Anxiety disorder, unspecified: Secondary | ICD-10-CM | POA: Diagnosis not present

## 2016-07-29 DIAGNOSIS — I10 Essential (primary) hypertension: Secondary | ICD-10-CM | POA: Diagnosis not present

## 2016-08-07 DIAGNOSIS — K219 Gastro-esophageal reflux disease without esophagitis: Secondary | ICD-10-CM | POA: Diagnosis not present

## 2016-08-07 DIAGNOSIS — N390 Urinary tract infection, site not specified: Secondary | ICD-10-CM | POA: Diagnosis not present

## 2016-08-07 DIAGNOSIS — M25569 Pain in unspecified knee: Secondary | ICD-10-CM | POA: Diagnosis not present

## 2016-08-07 DIAGNOSIS — F419 Anxiety disorder, unspecified: Secondary | ICD-10-CM | POA: Diagnosis not present

## 2016-08-07 DIAGNOSIS — I10 Essential (primary) hypertension: Secondary | ICD-10-CM | POA: Diagnosis not present

## 2016-08-15 DIAGNOSIS — I10 Essential (primary) hypertension: Secondary | ICD-10-CM | POA: Diagnosis not present

## 2016-08-15 DIAGNOSIS — E119 Type 2 diabetes mellitus without complications: Secondary | ICD-10-CM | POA: Diagnosis not present

## 2016-08-15 DIAGNOSIS — R269 Unspecified abnormalities of gait and mobility: Secondary | ICD-10-CM | POA: Diagnosis not present

## 2016-08-15 DIAGNOSIS — Z471 Aftercare following joint replacement surgery: Secondary | ICD-10-CM | POA: Diagnosis not present

## 2016-08-15 DIAGNOSIS — Z96652 Presence of left artificial knee joint: Secondary | ICD-10-CM | POA: Diagnosis not present

## 2016-08-20 DIAGNOSIS — Z96652 Presence of left artificial knee joint: Secondary | ICD-10-CM | POA: Diagnosis not present

## 2016-08-20 DIAGNOSIS — R269 Unspecified abnormalities of gait and mobility: Secondary | ICD-10-CM | POA: Diagnosis not present

## 2016-08-20 DIAGNOSIS — E119 Type 2 diabetes mellitus without complications: Secondary | ICD-10-CM | POA: Diagnosis not present

## 2016-08-20 DIAGNOSIS — Z471 Aftercare following joint replacement surgery: Secondary | ICD-10-CM | POA: Diagnosis not present

## 2016-08-20 DIAGNOSIS — I10 Essential (primary) hypertension: Secondary | ICD-10-CM | POA: Diagnosis not present

## 2016-08-21 ENCOUNTER — Ambulatory Visit: Payer: PPO | Admitting: Obstetrics and Gynecology

## 2016-08-21 DIAGNOSIS — D649 Anemia, unspecified: Secondary | ICD-10-CM | POA: Diagnosis not present

## 2016-08-21 DIAGNOSIS — I10 Essential (primary) hypertension: Secondary | ICD-10-CM | POA: Diagnosis not present

## 2016-08-21 DIAGNOSIS — N289 Disorder of kidney and ureter, unspecified: Secondary | ICD-10-CM | POA: Diagnosis not present

## 2016-08-22 DIAGNOSIS — I10 Essential (primary) hypertension: Secondary | ICD-10-CM | POA: Diagnosis not present

## 2016-08-22 DIAGNOSIS — Z471 Aftercare following joint replacement surgery: Secondary | ICD-10-CM | POA: Diagnosis not present

## 2016-08-22 DIAGNOSIS — R269 Unspecified abnormalities of gait and mobility: Secondary | ICD-10-CM | POA: Diagnosis not present

## 2016-08-22 DIAGNOSIS — E119 Type 2 diabetes mellitus without complications: Secondary | ICD-10-CM | POA: Diagnosis not present

## 2016-08-22 DIAGNOSIS — Z96652 Presence of left artificial knee joint: Secondary | ICD-10-CM | POA: Diagnosis not present

## 2016-08-28 DIAGNOSIS — Z96652 Presence of left artificial knee joint: Secondary | ICD-10-CM | POA: Diagnosis not present

## 2016-08-28 DIAGNOSIS — I10 Essential (primary) hypertension: Secondary | ICD-10-CM | POA: Diagnosis not present

## 2016-08-28 DIAGNOSIS — Z471 Aftercare following joint replacement surgery: Secondary | ICD-10-CM | POA: Diagnosis not present

## 2016-08-28 DIAGNOSIS — E119 Type 2 diabetes mellitus without complications: Secondary | ICD-10-CM | POA: Diagnosis not present

## 2016-08-28 DIAGNOSIS — R269 Unspecified abnormalities of gait and mobility: Secondary | ICD-10-CM | POA: Diagnosis not present

## 2016-08-30 DIAGNOSIS — I10 Essential (primary) hypertension: Secondary | ICD-10-CM | POA: Diagnosis not present

## 2016-08-30 DIAGNOSIS — Z471 Aftercare following joint replacement surgery: Secondary | ICD-10-CM | POA: Diagnosis not present

## 2016-08-30 DIAGNOSIS — Z96652 Presence of left artificial knee joint: Secondary | ICD-10-CM | POA: Diagnosis not present

## 2016-08-30 DIAGNOSIS — E119 Type 2 diabetes mellitus without complications: Secondary | ICD-10-CM | POA: Diagnosis not present

## 2016-08-30 DIAGNOSIS — R269 Unspecified abnormalities of gait and mobility: Secondary | ICD-10-CM | POA: Diagnosis not present

## 2016-09-03 DIAGNOSIS — I10 Essential (primary) hypertension: Secondary | ICD-10-CM | POA: Diagnosis not present

## 2016-09-04 DIAGNOSIS — Z96652 Presence of left artificial knee joint: Secondary | ICD-10-CM | POA: Diagnosis not present

## 2016-09-10 DIAGNOSIS — I10 Essential (primary) hypertension: Secondary | ICD-10-CM | POA: Diagnosis not present

## 2016-09-10 DIAGNOSIS — M8580 Other specified disorders of bone density and structure, unspecified site: Secondary | ICD-10-CM | POA: Diagnosis not present

## 2016-09-10 DIAGNOSIS — M79671 Pain in right foot: Secondary | ICD-10-CM | POA: Diagnosis not present

## 2016-09-10 DIAGNOSIS — R600 Localized edema: Secondary | ICD-10-CM | POA: Diagnosis not present

## 2016-09-10 DIAGNOSIS — Z0000 Encounter for general adult medical examination without abnormal findings: Secondary | ICD-10-CM | POA: Diagnosis not present

## 2016-09-11 DIAGNOSIS — L905 Scar conditions and fibrosis of skin: Secondary | ICD-10-CM | POA: Diagnosis not present

## 2016-09-11 DIAGNOSIS — D485 Neoplasm of uncertain behavior of skin: Secondary | ICD-10-CM | POA: Diagnosis not present

## 2016-09-11 DIAGNOSIS — D224 Melanocytic nevi of scalp and neck: Secondary | ICD-10-CM | POA: Diagnosis not present

## 2016-09-11 DIAGNOSIS — L298 Other pruritus: Secondary | ICD-10-CM | POA: Diagnosis not present

## 2016-09-11 DIAGNOSIS — L538 Other specified erythematous conditions: Secondary | ICD-10-CM | POA: Diagnosis not present

## 2016-09-11 DIAGNOSIS — L570 Actinic keratosis: Secondary | ICD-10-CM | POA: Diagnosis not present

## 2016-09-11 DIAGNOSIS — L578 Other skin changes due to chronic exposure to nonionizing radiation: Secondary | ICD-10-CM | POA: Diagnosis not present

## 2016-09-11 DIAGNOSIS — D1801 Hemangioma of skin and subcutaneous tissue: Secondary | ICD-10-CM | POA: Diagnosis not present

## 2016-09-17 ENCOUNTER — Other Ambulatory Visit: Payer: Self-pay | Admitting: Family Medicine

## 2016-09-17 DIAGNOSIS — Z1231 Encounter for screening mammogram for malignant neoplasm of breast: Secondary | ICD-10-CM

## 2016-10-23 ENCOUNTER — Ambulatory Visit
Admission: RE | Admit: 2016-10-23 | Discharge: 2016-10-23 | Disposition: A | Discharge status: Home / Self Care | Payer: PPO | Source: Ambulatory Visit | Attending: Family Medicine | Admitting: Family Medicine

## 2016-10-23 DIAGNOSIS — Z1231 Encounter for screening mammogram for malignant neoplasm of breast: Secondary | ICD-10-CM | POA: Diagnosis not present

## 2016-11-05 DIAGNOSIS — M8580 Other specified disorders of bone density and structure, unspecified site: Secondary | ICD-10-CM | POA: Diagnosis not present

## 2016-11-05 DIAGNOSIS — M8588 Other specified disorders of bone density and structure, other site: Secondary | ICD-10-CM | POA: Diagnosis not present

## 2016-11-06 DIAGNOSIS — R399 Unspecified symptoms and signs involving the genitourinary system: Secondary | ICD-10-CM | POA: Diagnosis not present

## 2016-11-06 DIAGNOSIS — Z79899 Other long term (current) drug therapy: Secondary | ICD-10-CM | POA: Diagnosis not present

## 2016-11-06 DIAGNOSIS — R040 Epistaxis: Secondary | ICD-10-CM | POA: Diagnosis not present

## 2016-11-14 ENCOUNTER — Encounter: Payer: Self-pay | Admitting: Obstetrics and Gynecology

## 2016-11-14 ENCOUNTER — Ambulatory Visit (INDEPENDENT_AMBULATORY_CARE_PROVIDER_SITE_OTHER): Payer: PPO | Admitting: Obstetrics and Gynecology

## 2016-11-14 VITALS — BP 172/77 | HR 73 | Ht 62.0 in | Wt 243.6 lb

## 2016-11-14 DIAGNOSIS — N3946 Mixed incontinence: Secondary | ICD-10-CM | POA: Diagnosis not present

## 2016-11-14 DIAGNOSIS — N8110 Cystocele, unspecified: Secondary | ICD-10-CM

## 2016-11-14 DIAGNOSIS — Z4689 Encounter for fitting and adjustment of other specified devices: Secondary | ICD-10-CM

## 2016-11-14 DIAGNOSIS — I10 Essential (primary) hypertension: Secondary | ICD-10-CM

## 2016-11-14 DIAGNOSIS — Z87440 Personal history of urinary (tract) infections: Secondary | ICD-10-CM | POA: Diagnosis not present

## 2016-11-14 NOTE — Progress Notes (Signed)
    GYNECOLOGY PROGRESS NOTE  Subjective:    Patient ID: Abigail Horne, female    DOB: October 09, 1946, 70 y.o.   MRN: [id number]  HPI  Patient is a 70 y.o. G44P2002 female who presents for a pessary check.  Pessary in place for cystocele (Grade 2), recurrent UTI's, and mixed urinary incontinence. She reports no vaginal bleeding or discharge. She denies pelvic discomfort and difficulty urinating or moving her bowels.  Denies complaints today.  On Detrol for OAB symptoms.   The following portions of the patient's history were reviewed and updated as appropriate: allergies, current medications, past family history, past medical history, past social history, past surgical history and problem list.  Review of Systems Pertinent items noted in HPI and remainder of comprehensive ROS otherwise negative.   Objective:   Blood pressure (!) 172/77, pulse 73, height 5\' 2"  (1.575 m), weight 243 lb 9.6 oz (110.5 kg). General appearance: alert and no distress Pelvis: Speculum examination revealed normal vaginal mucosa with no lesions or lacerations.  Size 1 ring with support pessary removed, cleaned, and replaced without difficulty.   Assessment:  Vaginal pessary in situ Cystocele (Grade 2)  H/o recurrent UTI's Mixed urinary incontinence Elevated BP (h/o HTN)  Plan:  The patient should return in 10-12 weeks for a pessary check and continue to use Trimo-San gelweekly as prescribed. Continue Detrol as prescribed.   Continue to encourage Kegel exercises  Elevated BP today, patient notes taking meds for BP.  Denies symptoms.  Will continue to monitor.    Rubie Maid, MD [facility]

## 2016-12-30 DIAGNOSIS — I6523 Occlusion and stenosis of bilateral carotid arteries: Secondary | ICD-10-CM | POA: Diagnosis not present

## 2016-12-30 DIAGNOSIS — I10 Essential (primary) hypertension: Secondary | ICD-10-CM | POA: Diagnosis not present

## 2016-12-30 DIAGNOSIS — E782 Mixed hyperlipidemia: Secondary | ICD-10-CM | POA: Diagnosis not present

## 2017-01-02 ENCOUNTER — Encounter: Payer: Self-pay | Admitting: Urology

## 2017-01-02 ENCOUNTER — Ambulatory Visit: Payer: PPO | Admitting: Urology

## 2017-01-02 VITALS — BP 155/78 | HR 72 | Ht 62.0 in | Wt 242.9 lb

## 2017-01-02 DIAGNOSIS — N3946 Mixed incontinence: Secondary | ICD-10-CM

## 2017-01-02 DIAGNOSIS — N390 Urinary tract infection, site not specified: Secondary | ICD-10-CM

## 2017-01-02 DIAGNOSIS — N8111 Cystocele, midline: Secondary | ICD-10-CM | POA: Diagnosis not present

## 2017-01-02 DIAGNOSIS — N952 Postmenopausal atrophic vaginitis: Secondary | ICD-10-CM | POA: Diagnosis not present

## 2017-01-02 LAB — BLADDER SCAN AMB NON-IMAGING: Scan Result: 0

## 2017-01-02 NOTE — Progress Notes (Signed)
01/02/2017 9:58 AM   Abigail Horne 10-16-1946 [id number]  Referring provider: Maryland Pink, MD [address] [facility] [place], [place] [address]  Chief Complaint  Patient presents with  . Follow-up    Recurrent UTI    HPI: Patient is a 71 year old Caucasian female with a cystocele, history of recurrent UTI's and atrophic vaginitis who presents today for a yearly recheck.  Cystocele Patient's cystocele is managed with a pessary.  She is seeing Dr. Marcelline Mates for maintenance and cleaning every three months.  She is not reporting any difficulty with the pessary today.  She is still having incontinence x 7 and nocturia x 3.  She engages in toilet mapping.  Her PVR today is 0 mL.  She does not find the incontinence bothersome as she only wears a light pad.    Recurrent UTI's Patient has not had any recent UTI's.  Her last documented UTI was on 06/26/2016.  She is not having symptoms at this time.    Atrophic vaginitis Patient is using her vaginal estrogen cream as prescribed.  She is not experiencing any vaginal burning or irritation.     PMH: Past Medical History:  Diagnosis Date  . Anxiety   . Arthritis   . Atrophic vaginitis   . BIH (benign intracranial hypertension)   . Bladder calculus   . Bladder prolapse, female, acquired   . Cervix abnormality   . Chronic cystitis   . Cirrhosis ([place])   . Cystocele   . GERD (gastroesophageal reflux disease)   . Hx-TIA (transient ischemic attack)   . Hyperlipidemia   . Hypertension   . Liver disease   . Nocturia   . Over weight   . Seasonal allergies   . Stress incontinence   . Trigonitis   . Urethral caruncle   . Urethral stricture   . Urinary urgency     Surgical History: Past Surgical History:  Procedure Laterality Date  . APPENDECTOMY  1955  . arthroscopic knee surgery Left 12/2014   meniscus  . BLADDER SURGERY  1999   tack  . GASTRIC BYPASS     lap band  . TONSILLECTOMY    . TOTAL KNEE ARTHROPLASTY  Left 07/23/2016   Procedure: TOTAL KNEE ARTHROPLASTY;  Surgeon: Hessie Knows, MD;  Location: [facility] ORS;  Service: Orthopedics;  Laterality: Left;  Marland Kitchen VAGINAL HYSTERECTOMY  1976    Home Medications:  Allergies as of 01/02/2017      Reactions   Contrast Media [iodinated Diagnostic Agents] Other (See Comments), Anaphylaxis   irregular heartbeat. irregular heartbeat.      Medication List       Accurate as of 01/02/17  9:58 AM. Always use your most recent med list.          enoxaparin 40 MG/0.4ML injection Commonly known as:  LOVENOX Inject 0.4 mLs (40 mg total) into the skin daily.   estradiol 0.1 MG/GM vaginal cream Commonly known as:  ESTRACE Place 1 Applicatorful vaginally at bedtime.   FLUZONE HIGH-DOSE 0.5 ML Susy Generic drug:  Influenza Vac Split High-Dose   furosemide 20 MG tablet Commonly known as:  LASIX Take 20 mg by mouth daily.   lisinopril-hydrochlorothiazide 20-25 MG tablet Commonly known as:  PRINZIDE,ZESTORETIC Take 1 tablet by mouth daily.   meloxicam 15 MG tablet Commonly known as:  MOBIC   metoprolol succinate 50 MG 24 hr tablet Commonly known as:  TOPROL-XL Take 50 mg by mouth daily. Take with or immediately  following a meal.   naproxen sodium 220 MG tablet Commonly known as:  ANAPROX Take by mouth.   omeprazole 20 MG capsule Commonly known as:  PRILOSEC Take 20 mg by mouth daily.   oxyCODONE 5 MG immediate release tablet Commonly known as:  Oxy IR/ROXICODONE Take 1-2 tablets (5-10 mg total) by mouth every 3 (three) hours as needed for breakthrough pain.   PRESERVISION/LUTEIN PO Take 1 tablet by mouth daily.   sertraline 100 MG tablet Commonly known as:  ZOLOFT TAKE 1 AND 1/2 TABLETS(150 MG) BY MOUTH EVERY DAY   sodium chloride 0.65 % Soln nasal spray Commonly known as:  OCEAN Place 1 spray into both nostrils daily as needed for congestion.   tolterodine 2 MG 24 hr capsule Commonly known as:  DETROL LA Take 1 capsule (2 mg total) by  mouth daily.   traMADol 50 MG tablet Commonly known as:  ULTRAM Take 1 tablet (50 mg total) by mouth every 6 (six) hours as needed.       Allergies:  Allergies  Allergen Reactions  . Contrast Media [Iodinated Diagnostic Agents] Other (See Comments) and Anaphylaxis    irregular heartbeat. irregular heartbeat.    Family History: Family History  Problem Relation Age of Onset  . Heart attack Father   . Heart disease Father   . Alzheimer's disease Mother   . Diabetes Mother   . Diabetes Brother   . Kidney disease Neg Hx   . Bladder Cancer Neg Hx   . Cancer Neg Hx   . Breast cancer Neg Hx     Social History:  reports that she has never smoked. She has never used smokeless tobacco. She reports that she does not drink alcohol or use drugs.  ROS: UROLOGY Frequent Urination?: No Hard to postpone urination?: No Burning/pain with urination?: No Get up at night to urinate?: Yes Leakage of urine?: Yes Urine stream starts and stops?: No Trouble starting stream?: No Do you have to strain to urinate?: No Blood in urine?: No Urinary tract infection?: No Sexually transmitted disease?: No Injury to kidneys or bladder?: No Painful intercourse?: No Weak stream?: No Currently pregnant?: No Vaginal bleeding?: No Last menstrual period?: n  Gastrointestinal Nausea?: No Vomiting?: No Indigestion/heartburn?: No Diarrhea?: No Constipation?: No  Constitutional Fever: No Night sweats?: No Weight loss?: No Fatigue?: No  Skin Skin rash/lesions?: No Itching?: No  Eyes Blurred vision?: No Double vision?: No  Ears/Nose/Throat Sore throat?: No Sinus problems?: Yes  Hematologic/Lymphatic Swollen glands?: No Easy bruising?: No  Cardiovascular Leg swelling?: Yes Chest pain?: No  Respiratory Cough?: No Shortness of breath?: No  Endocrine Excessive thirst?: No  Musculoskeletal Back pain?: No Joint pain?: Yes  Neurological Headaches?: No Dizziness?:  Yes  Psychologic Depression?: Yes Anxiety?: No  Physical Exam: BP (!) 155/78 (BP Location: Left Arm, Patient Position: Sitting, Cuff Size: Normal)   Pulse 72   Ht 5\' 2"  (1.575 m)   Wt 242 lb 14.4 oz (110.2 kg)   BMI 44.43 kg/m   Constitutional: Well nourished. Alert and oriented, No acute distress. HEENT: [place] AT, moist mucus membranes. Trachea midline, no masses. Cardiovascular: No clubbing, cyanosis, or edema. Respiratory: Normal respiratory effort, no increased work of breathing. GI: Abdomen is soft, non tender, non distended, no abdominal masses. Liver and spleen not palpable.  No hernias appreciated.  Stool sample for occult testing is not indicated.   GU: No CVA tenderness.  No bladder fullness or masses.  Atrophic external genitalia, normal pubic hair distribution,  no lesions.  Normal urethral meatus, no lesions, no prolapse, no discharge.   No urethral masses, tenderness and/or tenderness. No bladder fullness, tenderness or masses. Pale vagina mucosa, poor estrogen effect, no discharge, no lesions, good pelvic support,  Pessary in place.   Anus and perineum are without rashes or lesions.    Skin: No rashes, bruises or suspicious lesions. Lymph: No cervical or inguinal adenopathy. Neurologic: Grossly intact, no focal deficits, moving all 4 extremities. Psychiatric: Normal mood and affect.  Laboratory Data: Pertinent Imaging: Results for EMBRY, LINWOOD (MRN [id number]) as of 01/03/2016 16:55  Ref. Range 01/03/2016 10:11  Scan Result Unknown 0    Assessment & Plan:    1. Cystocele:   Managed with a pessary through Dr. Andreas Blower office.  2. Recurrent UTI  - reviewed UTI prevention strategies  - asked the patient to contact our office for UTI symptoms  - BLADDER SCAN AMB NON-IMAGING  3. Atrophic vaginitis:   She is using the cream as prescribed.  She finds it cost prohibitive, so we will provide samples.  RTC in one year for exam.    4. Incontinence  - not bothersome to  the patient at this time  - will continue to monitor  - RTC in one year for OAB questionnaire and PVR   Return in about 1 year (around 01/02/2018) for PVR, exam and OAB questionnaire.  These notes generated with voice recognition software. I apologize for typographical errors.  Zara Council, [facility] [address] [place], [place] [address] [phone]

## 2017-01-16 DIAGNOSIS — M79605 Pain in left leg: Secondary | ICD-10-CM | POA: Diagnosis not present

## 2017-01-16 DIAGNOSIS — M79604 Pain in right leg: Secondary | ICD-10-CM | POA: Diagnosis not present

## 2017-01-16 DIAGNOSIS — J3489 Other specified disorders of nose and nasal sinuses: Secondary | ICD-10-CM | POA: Diagnosis not present

## 2017-01-16 DIAGNOSIS — M791 Myalgia: Secondary | ICD-10-CM | POA: Diagnosis not present

## 2017-01-16 DIAGNOSIS — I10 Essential (primary) hypertension: Secondary | ICD-10-CM | POA: Diagnosis not present

## 2017-01-16 DIAGNOSIS — R252 Cramp and spasm: Secondary | ICD-10-CM | POA: Diagnosis not present

## 2017-02-12 DIAGNOSIS — R700 Elevated erythrocyte sedimentation rate: Secondary | ICD-10-CM | POA: Diagnosis not present

## 2017-02-13 ENCOUNTER — Encounter: Payer: PPO | Admitting: Obstetrics and Gynecology

## 2017-02-14 ENCOUNTER — Encounter: Payer: Self-pay | Admitting: Obstetrics and Gynecology

## 2017-02-14 ENCOUNTER — Ambulatory Visit (INDEPENDENT_AMBULATORY_CARE_PROVIDER_SITE_OTHER): Payer: PPO | Admitting: Obstetrics and Gynecology

## 2017-02-14 VITALS — BP 140/73 | HR 69 | Ht 62.0 in | Wt 242.4 lb

## 2017-02-14 DIAGNOSIS — N8110 Cystocele, unspecified: Secondary | ICD-10-CM

## 2017-02-14 DIAGNOSIS — Z87440 Personal history of urinary (tract) infections: Secondary | ICD-10-CM

## 2017-02-14 DIAGNOSIS — Z4689 Encounter for fitting and adjustment of other specified devices: Secondary | ICD-10-CM | POA: Diagnosis not present

## 2017-02-14 DIAGNOSIS — N3946 Mixed incontinence: Secondary | ICD-10-CM | POA: Diagnosis not present

## 2017-02-14 NOTE — Progress Notes (Signed)
    GYNECOLOGY PROGRESS NOTE  Subjective:    Patient ID: Abigail Horne, female    DOB: [birth year], 71 y.o.   MRN: [id number]  HPI  Patient is a 71 y.o. G83P2002 female who presents for a pessary check.  Pessary in place for cystocele (Grade 2), recurrent UTI's, and mixed urinary incontinence. She reports no vaginal bleeding or discharge. She denies pelvic discomfort and difficulty urinating or moving her bowels.  Denies complaints today.  Continues on Detrol for OAB symptoms.   The following portions of the patient's history were reviewed and updated as appropriate: allergies, current medications, past family history, past medical history, past social history, past surgical history and problem list.  Review of Systems Pertinent items noted in HPI and remainder of comprehensive ROS otherwise negative.   Objective:   Blood pressure 140/73, pulse 69, height 5\' 2"  (1.575 m), weight 242 lb 6.4 oz (110 kg). General appearance: alert and no distress Pelvis: Speculum examination revealed normal vaginal mucosa with no lesions or lacerations.  Size 1 ring with support pessary removed, cleaned, and replaced without difficulty.   Assessment:  Vaginal pessary in situ Cystocele (Grade 2)  H/o recurrent UTI's Mixed urinary incontinence  Plan:  The patient should return in 10-12 weeks for a pessary check and continue to use Trimo-San gelweekly as prescribed. Continue Detrol as prescribed.   Continue to encourage Kegel exercises    Rubie Maid, MD [facility]

## 2017-02-26 DIAGNOSIS — R350 Frequency of micturition: Secondary | ICD-10-CM | POA: Diagnosis not present

## 2017-04-09 DIAGNOSIS — L821 Other seborrheic keratosis: Secondary | ICD-10-CM | POA: Diagnosis not present

## 2017-04-09 DIAGNOSIS — L2389 Allergic contact dermatitis due to other agents: Secondary | ICD-10-CM | POA: Diagnosis not present

## 2017-04-09 DIAGNOSIS — Z872 Personal history of diseases of the skin and subcutaneous tissue: Secondary | ICD-10-CM | POA: Diagnosis not present

## 2017-04-23 DIAGNOSIS — M25562 Pain in left knee: Secondary | ICD-10-CM | POA: Diagnosis not present

## 2017-04-23 DIAGNOSIS — R32 Unspecified urinary incontinence: Secondary | ICD-10-CM | POA: Diagnosis not present

## 2017-04-23 DIAGNOSIS — R600 Localized edema: Secondary | ICD-10-CM | POA: Diagnosis not present

## 2017-04-23 DIAGNOSIS — I10 Essential (primary) hypertension: Secondary | ICD-10-CM | POA: Diagnosis not present

## 2017-04-24 DIAGNOSIS — M25562 Pain in left knee: Secondary | ICD-10-CM | POA: Diagnosis not present

## 2017-04-24 DIAGNOSIS — Z96652 Presence of left artificial knee joint: Secondary | ICD-10-CM | POA: Diagnosis not present

## 2017-04-25 DIAGNOSIS — R3989 Other symptoms and signs involving the genitourinary system: Secondary | ICD-10-CM | POA: Diagnosis not present

## 2017-04-25 HISTORY — PX: JOINT REPLACEMENT: SHX530

## 2017-05-02 IMAGING — DX DG KNEE 1-2V*L*
2 series · 2 of 2 positions shown · non-contrast
Comparison: CT left knee June 18, 2016

CLINICAL DATA: Status post total knee replacement

EXAM:
LEFT KNEE - 1-2 VIEW

[knee ap]
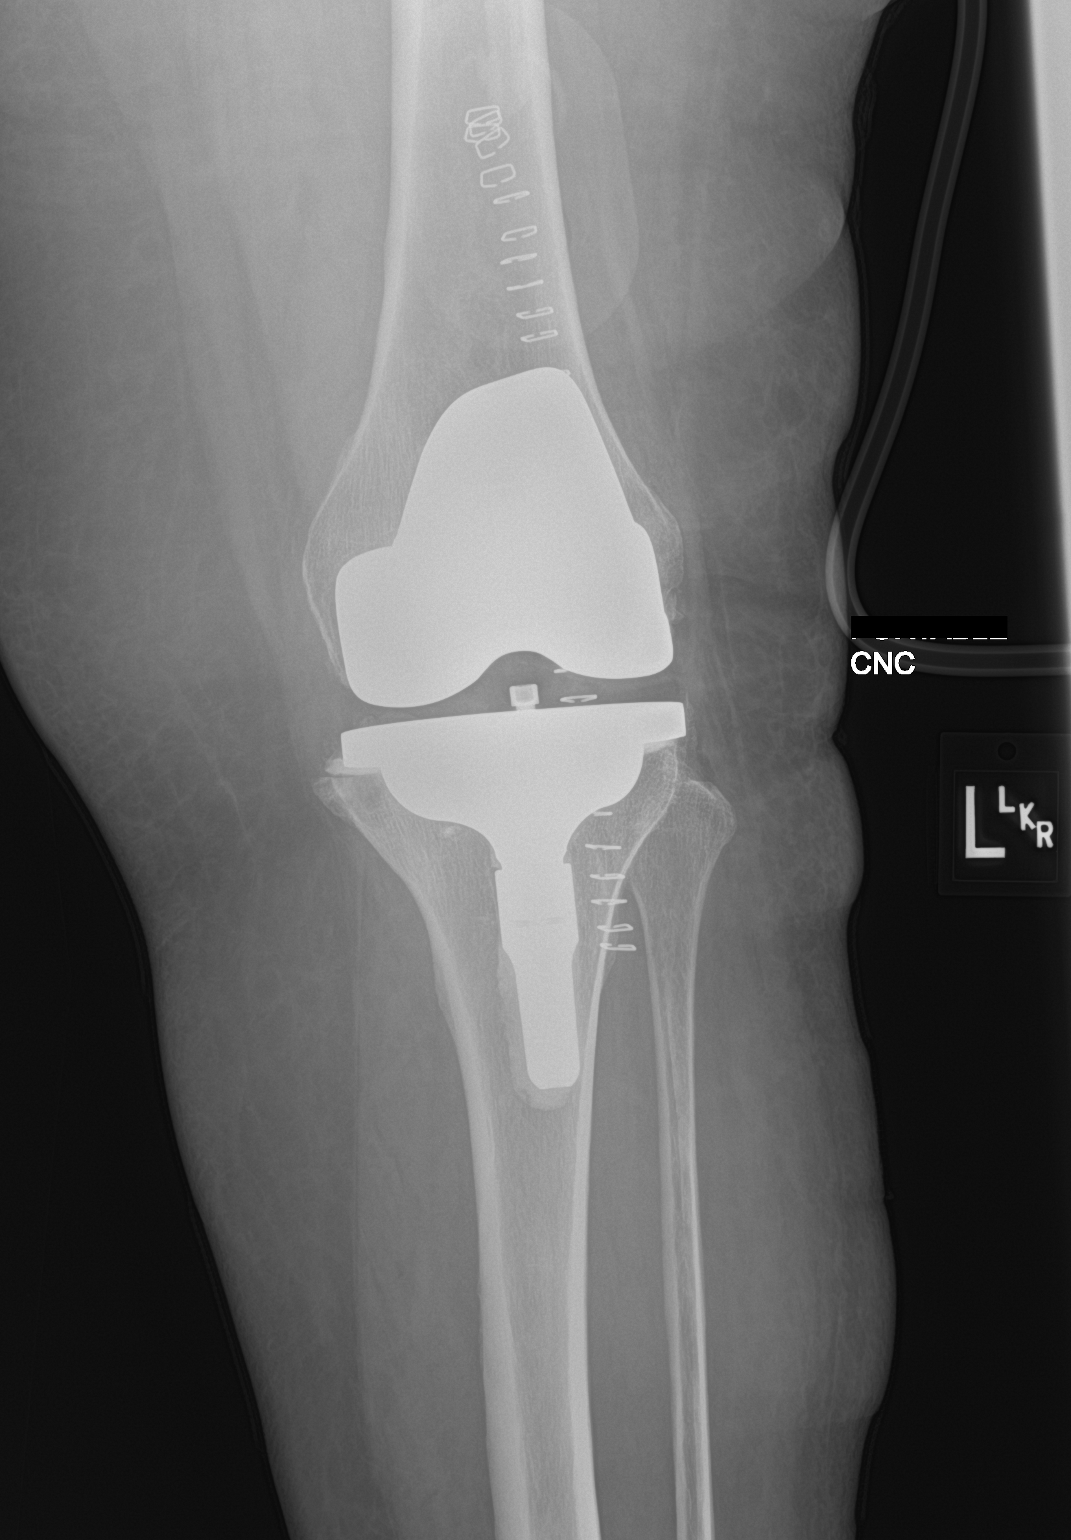

[knee lat]
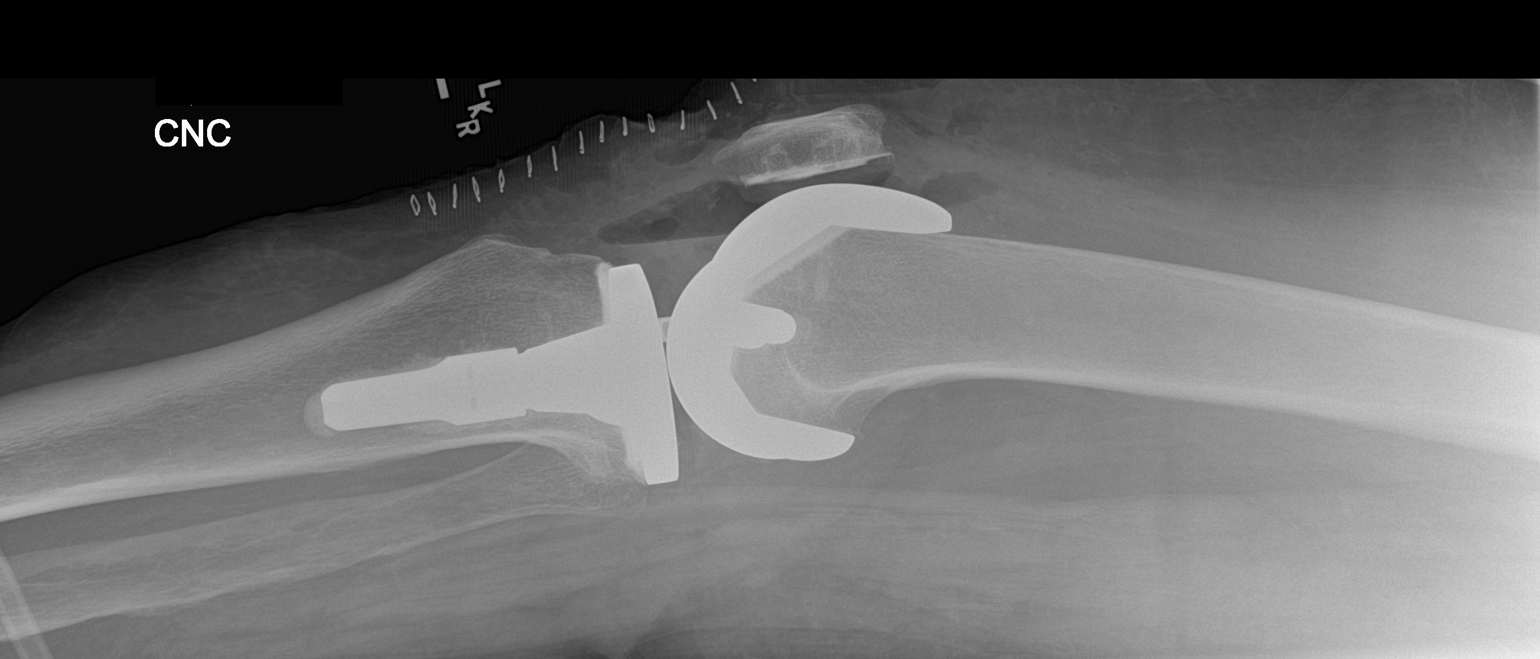

[2 of 2 positions shown; findings below may reference images not displayed]

FINDINGS: Frontal and lateral views obtained. The patient is status post total
knee replacement with the femoral and tibial prosthetic components
appearing well-seated. No fracture or dislocation evident. There
remains subchondral cystic change along the medial aspect of the
medial tibial plateau. Air within the joint is an expected
postoperative finding.
IMPRESSION: Prosthetic components appear well seated. No acute fracture or
dislocation. Subchondral cysts are noted in the medial aspect of the
medial tibial plateau, not significantly changed.

## 2017-05-08 DIAGNOSIS — M792 Neuralgia and neuritis, unspecified: Secondary | ICD-10-CM | POA: Diagnosis not present

## 2017-05-08 DIAGNOSIS — B029 Zoster without complications: Secondary | ICD-10-CM | POA: Diagnosis not present

## 2017-05-20 ENCOUNTER — Ambulatory Visit (INDEPENDENT_AMBULATORY_CARE_PROVIDER_SITE_OTHER): Payer: PPO | Admitting: Obstetrics and Gynecology

## 2017-05-20 VITALS — BP 128/66 | HR 71 | Ht 62.0 in | Wt 257.5 lb

## 2017-05-20 DIAGNOSIS — N8110 Cystocele, unspecified: Secondary | ICD-10-CM

## 2017-05-20 DIAGNOSIS — Z87440 Personal history of urinary (tract) infections: Secondary | ICD-10-CM

## 2017-05-20 DIAGNOSIS — N3946 Mixed incontinence: Secondary | ICD-10-CM

## 2017-05-20 NOTE — Progress Notes (Signed)
    GYNECOLOGY PROGRESS NOTE  Subjective:    Patient ID: Abigail Horne, female    DOB: Jul 01, 1946, 71 y.o.   MRN: [id number]  HPI  Patient is a 71 y.o. G58P2002 female who presents for a pessary check.  Pessary in place for cystocele (Grade 2), recurrent UTI's, and mixed urinary incontinence. She denies complaints today.  Reports no vaginal bleeding or discharge. She denies pelvic discomfort and difficulty urinating or moving her bowels.  Denies complaints today.  Remains on Detrol for OAB symptoms.   The following portions of the patient's history were reviewed and updated as appropriate: allergies, current medications, past family history, past medical history, past social history, past surgical history and problem list.  Review of Systems Pertinent items noted in HPI and remainder of comprehensive ROS otherwise negative.   Objective:   Blood pressure 128/66, pulse 71, height 5\' 2"  (1.575 m), weight 257 lb 8 oz (116.8 kg). Body mass index is 47.1 kg/m.  General appearance: alert and no distress Pelvis: Speculum examination revealed normal vaginal mucosa with no lesions or lacerations.  Size 1 ring with support pessary removed, cleaned, and replaced without difficulty.   Assessment:  Vaginal pessary in situ Cystocele (Grade 2)  H/o recurrent UTI's Mixed urinary incontinence Morbid obesity  Plan:  The patient should return in 10-12 weeks for a pessary check and continue to use Trimo-San gelweekly as prescribed. Continue Detrol as prescribed.   Advised patient to engage in weight bearing exercises as possible.    Abigail Maid, MD [facility]

## 2017-06-02 DIAGNOSIS — R600 Localized edema: Secondary | ICD-10-CM | POA: Diagnosis not present

## 2017-06-02 DIAGNOSIS — I10 Essential (primary) hypertension: Secondary | ICD-10-CM | POA: Diagnosis not present

## 2017-06-02 DIAGNOSIS — G4700 Insomnia, unspecified: Secondary | ICD-10-CM | POA: Diagnosis not present

## 2017-06-10 ENCOUNTER — Ambulatory Visit (INDEPENDENT_AMBULATORY_CARE_PROVIDER_SITE_OTHER): Payer: PPO | Admitting: Vascular Surgery

## 2017-06-10 ENCOUNTER — Encounter (INDEPENDENT_AMBULATORY_CARE_PROVIDER_SITE_OTHER): Payer: Self-pay | Admitting: Vascular Surgery

## 2017-06-10 VITALS — BP 175/81 | HR 70 | Resp 16 | Ht 61.0 in | Wt 257.0 lb

## 2017-06-10 DIAGNOSIS — E785 Hyperlipidemia, unspecified: Secondary | ICD-10-CM | POA: Diagnosis not present

## 2017-06-10 DIAGNOSIS — M1712 Unilateral primary osteoarthritis, left knee: Secondary | ICD-10-CM

## 2017-06-10 DIAGNOSIS — M7989 Other specified soft tissue disorders: Secondary | ICD-10-CM | POA: Diagnosis not present

## 2017-06-10 DIAGNOSIS — I10 Essential (primary) hypertension: Secondary | ICD-10-CM | POA: Diagnosis not present

## 2017-06-10 NOTE — Patient Instructions (Signed)
Edema Edema is when you have too much fluid in your body or under your skin. Edema may make your legs, feet, and ankles swell up. Swelling is also common in looser tissues, like around your eyes. This is a common condition. It gets more common as you get older. There are many possible causes of edema. Eating too much salt (sodium) and being on your feet or sitting for a long time can cause edema in your legs, feet, and ankles. Hot weather may make edema worse. Edema is usually painless. Your skin may look swollen or shiny. Follow these instructions at home:  Keep the swollen body part raised (elevated) above the level of your heart when you are sitting or lying down.  Do not sit still or stand for a long time.  Do not wear tight clothes. Do not wear garters on your upper legs.  Exercise your legs. This can help the swelling go down.  Wear elastic bandages or support stockings as told by your doctor.  Eat a low-salt (low-sodium) diet to reduce fluid as told by your doctor.  Depending on the cause of your swelling, you may need to limit how much fluid you drink (fluid restriction).  Take over-the-counter and prescription medicines only as told by your doctor. Contact a doctor if:  Treatment is not working.  You have heart, liver, or kidney disease and have symptoms of edema.  You have sudden and unexplained weight gain. Get help right away if:  You have shortness of breath or chest pain.  You cannot breathe when you lie down.  You have pain, redness, or warmth in the swollen areas.  You have heart, liver, or kidney disease and get edema all of a sudden.  You have a fever and your symptoms get worse all of a sudden. Summary  Edema is when you have too much fluid in your body or under your skin.  Edema may make your legs, feet, and ankles swell up. Swelling is also common in looser tissues, like around your eyes.  Raise (elevate) the swollen body part above the level of your  heart when you are sitting or lying down.  Follow your doctor's instructions about diet and how much fluid you can drink (fluid restriction). This information is not intended to replace advice given to you by your health care provider. Make sure you discuss any questions you have with your health care provider. Document Released: 04/29/2008 Document Revised: 11/29/2016 Document Reviewed: 11/29/2016 Elsevier Interactive Patient Education  2017 [employer]  

## 2017-06-10 NOTE — Progress Notes (Signed)
Patient ID: Abigail Horne, female   DOB: [birth year], 71 y.o.   MRN: [id number]  Chief Complaint  Patient presents with  . New Evaluation    bil le edema    HPI Abigail Horne is a 71 y.o. female.  I am asked to see the patient by Dr. Kary Kos for evaluation of leg swelling.  The patient reports Many months of progressive lower extremity swelling. This is worse on the left leg and the right leg. She had a knee replacement about the first of the year her swelling has definitely worsened since that time. She is now getting some blistering of the skin. The legs are very uncomfortable and are now getting very tender to the touch. The left lower extremity is the more severely affected of the 2 legs, but the right leg has some swelling too. She thinks the swelling may date back a year or 2 but it is not clear. For some time is not that bothersome. She does not have fever or chills. No these blisters has popped and she has not had any nonhealing ulcerations. She denies a previous history of DVT or superficial thrombophlebitis personally, but her father and a cousin had significant venous disease and multiple blood clots.   Past Medical History:  Diagnosis Date  . Anxiety   . Arthritis   . Atrophic vaginitis   . BIH (benign intracranial hypertension)   . Bladder calculus   . Bladder prolapse, female, acquired   . Cervix abnormality   . Chronic cystitis   . Cirrhosis ([place])   . Cystocele   . GERD (gastroesophageal reflux disease)   . Hx-TIA (transient ischemic attack)   . Hyperlipidemia   . Hypertension   . Liver disease   . Nocturia   . Over weight   . Seasonal allergies   . Stress incontinence   . Trigonitis   . Urethral caruncle   . Urethral stricture   . Urinary urgency     Past Surgical History:  Procedure Laterality Date  . APPENDECTOMY  1955  . arthroscopic knee surgery Left 12/2014   meniscus  . BLADDER SURGERY  1999   tack  . GASTRIC BYPASS     lap band  . TONSILLECTOMY     . TOTAL KNEE ARTHROPLASTY Left 07/23/2016   Procedure: TOTAL KNEE ARTHROPLASTY;  Surgeon: Hessie Knows, MD;  Location: [facility] ORS;  Service: Orthopedics;  Laterality: Left;  Marland Kitchen VAGINAL HYSTERECTOMY  1976    Family History  Problem Relation Age of Onset  . Heart attack Father   . Heart disease Father   . Alzheimer's disease Mother   . Diabetes Mother   . Diabetes Brother   . Kidney disease Neg Hx   . Bladder Cancer Neg Hx   . Cancer Neg Hx   . Breast cancer Neg Hx   Venous disease in her father and a cousin  Social History Social History  Substance Use Topics  . Smoking status: Never Smoker  . Smokeless tobacco: Never Used  . Alcohol use No  No IVDU  Allergies  Allergen Reactions  . Contrast Media [Iodinated Diagnostic Agents] Other (See Comments) and Anaphylaxis    irregular heartbeat. irregular heartbeat.    Current Outpatient Prescriptions  Medication Sig Dispense Refill  . lisinopril (PRINIVIL,ZESTRIL) 10 MG tablet     . metoprolol succinate (TOPROL-XL) 50 MG 24 hr tablet Take 50 mg by mouth daily. Take with or immediately following a meal.    .  Multiple Vitamins-Minerals (MULTIVITAMIN ADULT PO) Take by mouth.    . naproxen sodium (ANAPROX) 220 MG tablet Take by mouth.    . sertraline (ZOLOFT) 100 MG tablet TAKE 1 AND 1/2 TABLETS(150 MG) BY MOUTH EVERY DAY    . triamcinolone cream (KENALOG) 0.5 % Apply topically.    . Turmeric Curcumin 500 MG CAPS Take by mouth.    . vitamin B-12 (CYANOCOBALAMIN) 1000 MCG tablet Take by mouth.    Marland Kitchen augmented betamethasone dipropionate (DIPROLENE-AF) 0.05 % ointment APP EXT AA BID  1  . Cholecalciferol (VITAMIN D-1000 MAX ST) 1000 units tablet Take by mouth.    . enoxaparin (LOVENOX) 40 MG/0.4ML injection Inject 0.4 mLs (40 mg total) into the skin daily. 14 Syringe 0  . estradiol (ESTRACE) 0.1 MG/GM vaginal cream Place 1 Applicatorful vaginally at bedtime.    Marland Kitchen FLUZONE HIGH-DOSE 0.5 ML SUSY     . furosemide (LASIX) 20 MG tablet Take  20 mg by mouth daily.     Marland Kitchen gabapentin (NEURONTIN) 300 MG capsule   0  . lisinopril-hydrochlorothiazide (PRINZIDE,ZESTORETIC) 20-25 MG per tablet Take 1 tablet by mouth daily.    . meloxicam (MOBIC) 15 MG tablet     . Multiple Vitamins-Minerals (PRESERVISION/LUTEIN PO) Take 1 tablet by mouth daily.     . mupirocin ointment (BACTROBAN) 2 %     . omeprazole (PRILOSEC) 20 MG capsule Take 20 mg by mouth daily.    Marland Kitchen oxymetazoline (AFRIN) 0.05 % nasal spray Place into the nose.    . sodium chloride (OCEAN) 0.65 % SOLN nasal spray Place 1 spray into both nostrils daily as needed for congestion.    Marland Kitchen tolterodine (DETROL LA) 2 MG 24 hr capsule Take 1 capsule (2 mg total) by mouth daily. (Patient not taking: Reported on 06/10/2017) 30 capsule 3  . traMADol (ULTRAM) 50 MG tablet Take 1 tablet (50 mg total) by mouth every 6 (six) hours as needed. (Patient not taking: Reported on 06/10/2017) 30 tablet 0  . valACYclovir (VALTREX) 1000 MG tablet   0   No current facility-administered medications for this visit.       REVIEW OF SYSTEMS (Negative unless checked)  Constitutional: [] Weight loss  [] Fever  [] Chills Cardiac: [] Chest pain   [] Chest pressure   [] Palpitations   [] Shortness of breath when laying flat   [] Shortness of breath at rest   [] Shortness of breath with exertion. Vascular:  [] Pain in legs with walking   [] Pain in legs at rest   [] Pain in legs when laying flat   [] Claudication   [] Pain in feet when walking  [] Pain in feet at rest  [] Pain in feet when laying flat   [] History of DVT   [] Phlebitis   [x] Swelling in legs   [] Varicose veins   [] Non-healing ulcers Pulmonary:   [] Uses home oxygen   [] Productive cough   [] Hemoptysis   [] Wheeze  [] COPD   [] Asthma Neurologic:  [] Dizziness  [] Blackouts   [] Seizures   [] History of stroke   [] History of TIA  [] Aphasia   [] Temporary blindness   [] Dysphagia   [] Weakness or numbness in arms   [] Weakness or numbness in legs Musculoskeletal:  [x] Arthritis   [] Joint  swelling   [] Joint pain   [] Low back pain Hematologic:  [] Easy bruising  [] Easy bleeding   [] Hypercoagulable state   [] Anemic  [] Hepatitis Gastrointestinal:  [] Blood in stool   [] Vomiting blood  [x] Gastroesophageal reflux/heartburn   [] Abdominal pain Genitourinary:  [] Chronic kidney disease   [x] Difficult urination  [  x]Frequent urination  [] Burning with urination   [] Hematuria Skin:  [] Rashes   [] Ulcers   [] Wounds Psychological:  [x] History of anxiety   []  History of major depression.    Physical Exam BP (!) 175/81   Pulse 70   Resp 16   Ht 5\' 1"  (1.549 m)   Wt 257 lb (116.6 kg)   BMI 48.56 kg/m  Gen:  WD/WN, NAD. Obese Head: [place]/AT, No temporalis wasting.  Ear/Nose/Throat: Hearing grossly intact, nares w/o erythema or drainage, oropharynx w/o Erythema/Exudate Eyes: Conjunctiva clear, sclera non-icteric  Neck: trachea midline.  No JVD.  Pulmonary:  Good air movement, respirations not labored, no use of accessory muscles Cardiac: RRR Vascular:  Vessel Right Left  Radial Palpable Palpable                      Popliteal 1+ Palpable Not Palpable  PT 1+ Palpable Not Palpable  DP 1+ Palpable 1+ Palpable   Gastrointestinal: soft, non-tender/non-distended.  Musculoskeletal: M/S 5/5 throughout.  Extremities without ischemic changes.  No deformity or atrophy. 1+ right lower extremity edema, 2+ left lower extremity edema. Some blisters are present on the skin of the left medial calf area but no open ulcerations Neurologic: Sensation grossly intact in extremities.  Symmetrical.  Speech is fluent. Motor exam as listed above. Psychiatric: Judgment intact, Mood & affect appropriate for pt's clinical situation. Dermatologic: Some blistering is present on the medial calf area of the left leg, but there are no open ulcerations. There is no erythema or drainage.   Radiology No results found.  Labs No results found for this or any previous visit (from the past 2160  hour(s)).  Assessment/Plan:  Essential (primary) hypertension blood pressure control important in reducing the progression of atherosclerotic disease. On appropriate oral medications.   Hyperlipidemia lipid control important in reducing the progression of atherosclerotic disease.    Primary localized osteoarthritis of left knee Status post knee replacement. Swelling has been worse and knee replacement.  Swelling of limb The patient has lower extremity swelling that is significant and impairs their normal activities. Discussed the multiple causes of lower extremity swelling. We discussed the pathophysiology and natural history of both venous disease and lymphedema as potential vascular causes of lower extremity swelling. We will obtain a venous reflux study at the patient's convenience in the near future. We will see the patient back following the study to discuss the results and determine further treatment options. Conservative therapies for leg swelling would include increasing exercise, leg elevation, weight loss, and the daily use of graduated compression stockings 20-30 mmHg and strength. We discussed using the stockings on a daily basis starting first thing in the morning and wearing them throughout the day. These should be worn daily.      Leotis Pain 06/10/2017, 1:58 PM   This note was created with Dragon medical transcription system.  Any errors from dictation are unintentional.

## 2017-06-10 NOTE — Assessment & Plan Note (Signed)
Status post knee replacement. Swelling has been worse and knee replacement.

## 2017-06-10 NOTE — Assessment & Plan Note (Signed)
lipid control important in reducing the progression of atherosclerotic disease.   

## 2017-06-10 NOTE — Assessment & Plan Note (Signed)
The patient has lower extremity swelling that is significant and impairs their normal activities. Discussed the multiple causes of lower extremity swelling. We discussed the pathophysiology and natural history of both venous disease and lymphedema as potential vascular causes of lower extremity swelling. We will obtain a venous reflux study at the patient's convenience in the near future. We will see the patient back following the study to discuss the results and determine further treatment options. Conservative therapies for leg swelling would include increasing exercise, leg elevation, weight loss, and the daily use of graduated compression stockings 20-30 mmHg and strength. We discussed using the stockings on a daily basis starting first thing in the morning and wearing them throughout the day. These should be worn daily. 

## 2017-06-10 NOTE — Assessment & Plan Note (Signed)
blood pressure control important in reducing the progression of atherosclerotic disease. On appropriate oral medications.  

## 2017-06-20 ENCOUNTER — Other Ambulatory Visit: Payer: Self-pay | Admitting: Gastroenterology

## 2017-06-20 DIAGNOSIS — R7989 Other specified abnormal findings of blood chemistry: Secondary | ICD-10-CM | POA: Diagnosis not present

## 2017-06-20 DIAGNOSIS — K219 Gastro-esophageal reflux disease without esophagitis: Secondary | ICD-10-CM | POA: Diagnosis not present

## 2017-06-20 DIAGNOSIS — R945 Abnormal results of liver function studies: Principal | ICD-10-CM

## 2017-06-20 DIAGNOSIS — R1310 Dysphagia, unspecified: Secondary | ICD-10-CM

## 2017-07-04 ENCOUNTER — Ambulatory Visit
Admission: RE | Admit: 2017-07-04 | Discharge: 2017-07-04 | Disposition: A | Discharge status: Home / Self Care | Payer: PPO | Source: Ambulatory Visit | Attending: Gastroenterology | Admitting: Gastroenterology

## 2017-07-04 DIAGNOSIS — K224 Dyskinesia of esophagus: Secondary | ICD-10-CM | POA: Insufficient documentation

## 2017-07-04 DIAGNOSIS — R1310 Dysphagia, unspecified: Secondary | ICD-10-CM | POA: Insufficient documentation

## 2017-07-04 DIAGNOSIS — K219 Gastro-esophageal reflux disease without esophagitis: Secondary | ICD-10-CM | POA: Insufficient documentation

## 2017-07-04 DIAGNOSIS — K449 Diaphragmatic hernia without obstruction or gangrene: Secondary | ICD-10-CM | POA: Diagnosis not present

## 2017-07-04 DIAGNOSIS — R7989 Other specified abnormal findings of blood chemistry: Secondary | ICD-10-CM | POA: Diagnosis not present

## 2017-07-04 DIAGNOSIS — R945 Abnormal results of liver function studies: Secondary | ICD-10-CM

## 2017-07-04 DIAGNOSIS — K8020 Calculus of gallbladder without cholecystitis without obstruction: Secondary | ICD-10-CM | POA: Insufficient documentation

## 2017-07-07 DIAGNOSIS — I6523 Occlusion and stenosis of bilateral carotid arteries: Secondary | ICD-10-CM | POA: Diagnosis not present

## 2017-07-07 DIAGNOSIS — I10 Essential (primary) hypertension: Secondary | ICD-10-CM | POA: Diagnosis not present

## 2017-07-07 DIAGNOSIS — E782 Mixed hyperlipidemia: Secondary | ICD-10-CM | POA: Diagnosis not present

## 2017-07-08 ENCOUNTER — Ambulatory Visit (INDEPENDENT_AMBULATORY_CARE_PROVIDER_SITE_OTHER): Payer: PPO

## 2017-07-08 ENCOUNTER — Encounter (INDEPENDENT_AMBULATORY_CARE_PROVIDER_SITE_OTHER): Payer: Self-pay | Admitting: Vascular Surgery

## 2017-07-08 ENCOUNTER — Ambulatory Visit (INDEPENDENT_AMBULATORY_CARE_PROVIDER_SITE_OTHER): Payer: PPO | Admitting: Vascular Surgery

## 2017-07-08 VITALS — BP 180/80 | HR 86 | Resp 16 | Ht 62.0 in | Wt 254.0 lb

## 2017-07-08 DIAGNOSIS — M7989 Other specified soft tissue disorders: Secondary | ICD-10-CM | POA: Diagnosis not present

## 2017-07-08 DIAGNOSIS — E785 Hyperlipidemia, unspecified: Secondary | ICD-10-CM

## 2017-07-08 DIAGNOSIS — I10 Essential (primary) hypertension: Secondary | ICD-10-CM | POA: Diagnosis not present

## 2017-07-08 NOTE — Progress Notes (Signed)
MRN : [id number]  Abigail Horne is a 71 y.o. (1946-03-07) female who presents with chief complaint of  Chief Complaint  Patient presents with  . Follow-up  .  History of Present Illness: Patient returns today in follow up of leg pain and swelling. She has tried a couple of compression stockings without finding one fit or work for her currently. Her leg symptoms are basically the same. Her duplex findings today are nearly normal with no DVT, superficial thrombophlebitis, or significant venous reflux seen in the left lower extremity and only minimal venous reflux seen in the right calf great saphenous vein in the right lower extremity.  Past Medical History:  Diagnosis Date  . Anxiety   . Arthritis   . Atrophic vaginitis   . BIH (benign intracranial hypertension)   . Bladder calculus   . Bladder prolapse, female, acquired   . Cervix abnormality   . Chronic cystitis   . Cirrhosis ([place])   . Cystocele   . GERD (gastroesophageal reflux disease)   . Hx-TIA (transient ischemic attack)   . Hyperlipidemia   . Hypertension   . Liver disease   . Nocturia   . Over weight   . Seasonal allergies   . Stress incontinence   . Trigonitis   . Urethral caruncle   . Urethral stricture   . Urinary urgency          Past Surgical History:  Procedure Laterality Date  . APPENDECTOMY  1955  . arthroscopic knee surgery Left 12/2014   meniscus  . BLADDER SURGERY  1999   tack  . GASTRIC BYPASS     lap band  . TONSILLECTOMY    . TOTAL KNEE ARTHROPLASTY Left 07/23/2016   Procedure: TOTAL KNEE ARTHROPLASTY;  Surgeon: Hessie Knows, MD;  Location: [facility] ORS;  Service: Orthopedics;  Laterality: Left;  Marland Kitchen VAGINAL HYSTERECTOMY  1976         Family History  Problem Relation Age of Onset  . Heart attack Father   . Heart disease Father   . Alzheimer's disease Mother   . Diabetes Mother   . Diabetes Brother   . Kidney disease Neg Hx   . Bladder Cancer  Neg Hx   . Cancer Neg Hx   . Breast cancer Neg Hx   Venous disease in her father and a cousin  Social History     Social History  Substance Use Topics  . Smoking status: Never Smoker  . Smokeless tobacco: Never Used  . Alcohol use No  No IVDU       Allergies  Allergen Reactions  . Contrast Media [Iodinated Diagnostic Agents] Other (See Comments) and Anaphylaxis    irregular heartbeat. irregular heartbeat.          Current Outpatient Prescriptions  Medication Sig Dispense Refill  . lisinopril (PRINIVIL,ZESTRIL) 10 MG tablet     . metoprolol succinate (TOPROL-XL) 50 MG 24 hr tablet Take 50 mg by mouth daily. Take with or immediately following a meal.    . Multiple Vitamins-Minerals (MULTIVITAMIN ADULT PO) Take by mouth.    . naproxen sodium (ANAPROX) 220 MG tablet Take by mouth.    . sertraline (ZOLOFT) 100 MG tablet TAKE 1 AND 1/2 TABLETS(150 MG) BY MOUTH EVERY DAY    . triamcinolone cream (KENALOG) 0.5 % Apply topically.    . Turmeric Curcumin 500 MG CAPS Take by mouth.    . vitamin B-12 (CYANOCOBALAMIN) 1000 MCG tablet Take by mouth.    Marland Kitchen  augmented betamethasone dipropionate (DIPROLENE-AF) 0.05 % ointment APP EXT AA BID  1  . Cholecalciferol (VITAMIN D-1000 MAX ST) 1000 units tablet Take by mouth.    . enoxaparin (LOVENOX) 40 MG/0.4ML injection Inject 0.4 mLs (40 mg total) into the skin daily. 14 Syringe 0  . estradiol (ESTRACE) 0.1 MG/GM vaginal cream Place 1 Applicatorful vaginally at bedtime.    Marland Kitchen FLUZONE HIGH-DOSE 0.5 ML SUSY     . furosemide (LASIX) 20 MG tablet Take 20 mg by mouth daily.     Marland Kitchen gabapentin (NEURONTIN) 300 MG capsule   0  . lisinopril-hydrochlorothiazide (PRINZIDE,ZESTORETIC) 20-25 MG per tablet Take 1 tablet by mouth daily.    . meloxicam (MOBIC) 15 MG tablet     . Multiple Vitamins-Minerals (PRESERVISION/LUTEIN PO) Take 1 tablet by mouth daily.     . mupirocin ointment (BACTROBAN) 2 %     . omeprazole  (PRILOSEC) 20 MG capsule Take 20 mg by mouth daily.    Marland Kitchen oxymetazoline (AFRIN) 0.05 % nasal spray Place into the nose.    . sodium chloride (OCEAN) 0.65 % SOLN nasal spray Place 1 spray into both nostrils daily as needed for congestion.    Marland Kitchen tolterodine (DETROL LA) 2 MG 24 hr capsule Take 1 capsule (2 mg total) by mouth daily. (Patient not taking: Reported on 06/10/2017) 30 capsule 3  . traMADol (ULTRAM) 50 MG tablet Take 1 tablet (50 mg total) by mouth every 6 (six) hours as needed. (Patient not taking: Reported on 06/10/2017) 30 tablet 0  . valACYclovir (VALTREX) 1000 MG tablet   0   No current facility-administered medications for this visit.       REVIEW OF SYSTEMS (Negative unless checked)  Constitutional: [] Weight loss  [] Fever  [] Chills Cardiac: [] Chest pain   [] Chest pressure   [] Palpitations   [] Shortness of breath when laying flat   [] Shortness of breath at rest   [] Shortness of breath with exertion. Vascular:  [] Pain in legs with walking   [] Pain in legs at rest   [] Pain in legs when laying flat   [] Claudication   [] Pain in feet when walking  [] Pain in feet at rest  [] Pain in feet when laying flat   [] History of DVT   [] Phlebitis   [x] Swelling in legs   [] Varicose veins   [] Non-healing ulcers Pulmonary:   [] Uses home oxygen   [] Productive cough   [] Hemoptysis   [] Wheeze  [] COPD   [] Asthma Neurologic:  [] Dizziness  [] Blackouts   [] Seizures   [] History of stroke   [] History of TIA  [] Aphasia   [] Temporary blindness   [] Dysphagia   [] Weakness or numbness in arms   [] Weakness or numbness in legs Musculoskeletal:  [x] Arthritis   [] Joint swelling   [] Joint pain   [] Low back pain Hematologic:  [] Easy bruising  [] Easy bleeding   [] Hypercoagulable state   [] Anemic  [] Hepatitis Gastrointestinal:  [] Blood in stool   [] Vomiting blood  [x] Gastroesophageal reflux/heartburn   [] Abdominal pain Genitourinary:  [] Chronic kidney disease   [x] Difficult urination  [x] Frequent urination   [] Burning with urination   [] Hematuria Skin:  [] Rashes   [] Ulcers   [] Wounds Psychological:  [x] History of anxiety   []  History of major depression.    Physical Examination  BP (!) 180/80   Pulse 86   Resp 16   Ht 5\' 2"  (1.575 m)   Wt 254 lb (115.2 kg)   BMI 46.46 kg/m  Gen:  WD/WN, NAD. Obese  Head: [place]/AT, No temporalis wasting. Ear/Nose/Throat: Hearing  grossly intact, nares w/o erythema or drainage, trachea midline Eyes: Conjunctiva clear. Sclera non-icteric Neck: Supple.  No JVD.  Pulmonary:  Good air movement, no use of accessory muscles.  Cardiac: RRR, normal S1, S2 Vascular:  Vessel Right Left  Radial Palpable Palpable                      Popliteal 1+ Palpable Not Palpable  PT 1+ Palpable Not Palpable  DP 1+ Palpable 1+ Palpable    Musculoskeletal: M/S 5/5 throughout.  No deformity or atrophy. 1+ BLE edema. Neurologic: Sensation grossly intact in extremities.  Symmetrical.  Speech is fluent.  Psychiatric: Judgment intact, Mood & affect appropriate for pt's clinical situation. Dermatologic: No rashes or ulcers noted.  No cellulitis or open wounds.       Labs No results found for this or any previous visit (from the past 2160 hour(s)).  Radiology Dg Esophagus  Result Date: 07/04/2017 CLINICAL DATA:  Dysphagia with liquid and solid material EXAM: ESOPHOGRAM / BARIUM SWALLOW / BARIUM TABLET STUDY TECHNIQUE: Combined double contrast and single contrast examination performed using thick liquid barium and thin barium liquid. The patient was observed with fluoroscopy swallowing a 13 mm barium sulphate tablet. FLUOROSCOPY TIME:  Fluoroscopy Time:  2 minutes 12 seconds Radiation Exposure Index (if provided by the fluoroscopic device): 8.30 mGy Number of Acquired Spot Images: 17 COMPARISON:  None. FINDINGS: The patient initiates deglutition without appreciable difficulty. There is moderate esophageal dysmotility with multiple tertiary contractions. There is a focal  hiatal hernia with reflux of a full, barium to the upper esophagus which is slowed empty. There is no appreciable esophageal mass, stricture, or ulceration. Pharynx appears unremarkable. 13 mm barium tablet passed freely to the hiatal hernia. The tablet remained in the hiatal hernia for approximately 1 minute, passing intact into the remainder the stomach after several boluses of water. IMPRESSION: Moderate esophageal dysmotility with liquid material. 13 mm barium tablet passed freely to the hiatal hernia but required several boluses of water to pass through the hiatal hernia into the remainder the stomach. Small hiatal hernia with diffuse reflux which is slowed empty. No esophageal stricture, mass, or ulceration. No pharyngeal lesion evident. Electronically Signed   By: Lowella Grip III M.D.   On: 07/04/2017 09:32   US Abdomen Limited Ruq  Result Date: 07/04/2017 CLINICAL DATA:  Elevated liver enzymes EXAM: ULTRASOUND ABDOMEN LIMITED RIGHT UPPER QUADRANT COMPARISON:  February 15, 2011 FINDINGS: Gallbladder: Within the gallbladder, there are multiple echogenic foci which move and shadow consistent with cholelithiasis. Largest gallstone measures 1.0 cm in length. No gallbladder wall thickening or pericholecystic fluid. No sonographic Murphy sign noted by sonographer. Common bile duct: Diameter: 4 mm. There is no intrahepatic or extrahepatic biliary duct dilatation. Liver: No focal lesion identified. Liver echogenicity is increased diffusely. IMPRESSION: Cholelithiasis. Increased liver echogenicity, a finding most likely indicative of hepatic steatosis. While no focal liver lesions are evident, it must be cautioned that the sensitivity of ultrasound for detection of focal liver lesions is diminished in this circumstance. Electronically Signed   By: Lowella Grip III M.D.   On: 07/04/2017 09:34    Assessment/Plan Essential (primary) hypertension blood pressure control important in reducing the  progression of atherosclerotic disease. On appropriate oral medications.   Hyperlipidemia lipid control important in reducing the progression of atherosclerotic disease.    Primary localized osteoarthritis of left knee Status post knee replacement. Swelling has been worse and knee replacement.  Swelling of limb Her  duplex findings today are nearly normal with no DVT, superficial thrombophlebitis, or significant venous reflux seen in the left lower extremity and only minimal venous reflux seen in the right calf great saphenous vein in the right lower extremity. No venous intervention is of benefit. Would plan only finding some compression stockings at work for her at this point. We discussed the additional lymphedema pump which might be an excellent adjuvant therapy, but she would like to wait and see how she does if she finds stockings at work. It was discussed that the lymphedema pump would not really work without stockings as well as the improvement is transient if stockings are not worn to maintain the improvement. She will return on an as-needed basis.    Leotis Pain, MD  07/08/2017 5:20 PM    This note was created with Dragon medical transcription system.  Any errors from dictation are purely unintentional

## 2017-07-08 NOTE — Assessment & Plan Note (Signed)
Her duplex findings today are nearly normal with no DVT, superficial thrombophlebitis, or significant venous reflux seen in the left lower extremity and only minimal venous reflux seen in the right calf great saphenous vein in the right lower extremity. No venous intervention is of benefit. Would plan only finding some compression stockings at work for her at this point. We discussed the additional lymphedema pump which might be an excellent adjuvant therapy, but she would like to wait and see how she does if she finds stockings at work. It was discussed that the lymphedema pump would not really work without stockings as well as the improvement is transient if stockings are not worn to maintain the improvement. She will return on an as-needed basis.

## 2017-08-11 DIAGNOSIS — R1310 Dysphagia, unspecified: Secondary | ICD-10-CM | POA: Diagnosis not present

## 2017-08-11 DIAGNOSIS — R945 Abnormal results of liver function studies: Secondary | ICD-10-CM | POA: Diagnosis not present

## 2017-08-11 DIAGNOSIS — K219 Gastro-esophageal reflux disease without esophagitis: Secondary | ICD-10-CM | POA: Diagnosis not present

## 2017-08-11 DIAGNOSIS — K449 Diaphragmatic hernia without obstruction or gangrene: Secondary | ICD-10-CM | POA: Diagnosis not present

## 2017-08-15 ENCOUNTER — Ambulatory Visit (INDEPENDENT_AMBULATORY_CARE_PROVIDER_SITE_OTHER): Payer: PPO | Admitting: Vascular Surgery

## 2017-08-15 ENCOUNTER — Encounter (INDEPENDENT_AMBULATORY_CARE_PROVIDER_SITE_OTHER): Payer: PPO

## 2017-08-20 ENCOUNTER — Encounter: Payer: Self-pay | Admitting: Obstetrics and Gynecology

## 2017-08-20 ENCOUNTER — Ambulatory Visit (INDEPENDENT_AMBULATORY_CARE_PROVIDER_SITE_OTHER): Payer: PPO | Admitting: Obstetrics and Gynecology

## 2017-08-20 VITALS — BP 139/71 | HR 69 | Ht 62.0 in | Wt 251.0 lb

## 2017-08-20 DIAGNOSIS — Z87440 Personal history of urinary (tract) infections: Secondary | ICD-10-CM

## 2017-08-20 DIAGNOSIS — Z4689 Encounter for fitting and adjustment of other specified devices: Secondary | ICD-10-CM | POA: Diagnosis not present

## 2017-08-20 DIAGNOSIS — N3946 Mixed incontinence: Secondary | ICD-10-CM | POA: Diagnosis not present

## 2017-08-20 DIAGNOSIS — N8110 Cystocele, unspecified: Secondary | ICD-10-CM | POA: Diagnosis not present

## 2017-08-20 NOTE — Progress Notes (Signed)
    GYNECOLOGY PROGRESS NOTE  Subjective:    Patient ID: Abigail Horne, female    DOB: 1946/02/15, 71 y.o.   MRN: [id number]  HPI  Patient is a 71 y.o. G37P2002 female who presents for a pessary check.  She denies complaints today. Pessary in place for cystocele (Grade 2), recurrent UTI's, and mixed urinary incontinence.  Reports no vaginal bleeding or discharge. She denies pelvic discomfort and difficulty urinating or moving her bowels. Is on Detrol for OAB symptoms.   The following portions of the patient's history were reviewed and updated as appropriate: allergies, current medications, past family history, past medical history, past social history, past surgical history and problem list.  Review of Systems A comprehensive review of systems was negative except for: patient notes h/o recent fall several weeks ago.  Notes that she hurt her face but doesn't think anything was broken. Was bruised for several days. DId not follow up at the hospitall   Objective:   Blood pressure 139/71, pulse 69, height 5\' 2"  (1.575 m), weight 251 lb (113.9 kg). Body mass index is 45.91 kg/m.  General appearance: alert and no distress Pelvis: Speculum examination revealed normal vaginal mucosa with no lesions or lacerations.  Size 1 ring with support pessary removed, cleaned, and replaced without difficulty.   Assessment:  Encounter for pessary maintenance Cystocele (Grade 2)  H/o recurrent UTI's Mixed urinary incontinence Morbid obesity  Plan:  The patient should return in 10-12 weeks for a pessary check and continue to use Trimo-San gelweekly as prescribed. Continue Detrol as prescribed.   Continued to encourage patient to engage in weight bearing exercises as possible.  Rubie Maid, MD [facility]

## 2017-09-09 ENCOUNTER — Encounter: Payer: Self-pay | Admitting: *Deleted

## 2017-09-10 ENCOUNTER — Ambulatory Visit: Payer: PPO | Admitting: Anesthesiology

## 2017-09-10 ENCOUNTER — Encounter: Payer: Self-pay | Admitting: *Deleted

## 2017-09-10 ENCOUNTER — Ambulatory Visit: Admit: 2017-09-10 | Payer: PPO | Admitting: Gastroenterology

## 2017-09-10 ENCOUNTER — Ambulatory Visit
Admission: RE | Admit: 2017-09-10 | Discharge: 2017-09-10 | Disposition: A | Discharge status: Home / Self Care | Payer: PPO | Source: Ambulatory Visit | Attending: Internal Medicine | Admitting: Internal Medicine

## 2017-09-10 ENCOUNTER — Encounter
Admission: RE | Disposition: A | Discharge status: Home / Self Care | Payer: Self-pay | Source: Ambulatory Visit | Attending: Internal Medicine

## 2017-09-10 DIAGNOSIS — Z91041 Radiographic dye allergy status: Secondary | ICD-10-CM | POA: Insufficient documentation

## 2017-09-10 DIAGNOSIS — K3189 Other diseases of stomach and duodenum: Secondary | ICD-10-CM | POA: Insufficient documentation

## 2017-09-10 DIAGNOSIS — R1314 Dysphagia, pharyngoesophageal phase: Secondary | ICD-10-CM | POA: Diagnosis not present

## 2017-09-10 DIAGNOSIS — K7460 Unspecified cirrhosis of liver: Secondary | ICD-10-CM | POA: Diagnosis not present

## 2017-09-10 DIAGNOSIS — E785 Hyperlipidemia, unspecified: Secondary | ICD-10-CM | POA: Insufficient documentation

## 2017-09-10 DIAGNOSIS — I10 Essential (primary) hypertension: Secondary | ICD-10-CM | POA: Insufficient documentation

## 2017-09-10 DIAGNOSIS — Z79899 Other long term (current) drug therapy: Secondary | ICD-10-CM | POA: Insufficient documentation

## 2017-09-10 DIAGNOSIS — K219 Gastro-esophageal reflux disease without esophagitis: Secondary | ICD-10-CM | POA: Insufficient documentation

## 2017-09-10 DIAGNOSIS — R1312 Dysphagia, oropharyngeal phase: Secondary | ICD-10-CM | POA: Insufficient documentation

## 2017-09-10 DIAGNOSIS — M1990 Unspecified osteoarthritis, unspecified site: Secondary | ICD-10-CM | POA: Diagnosis not present

## 2017-09-10 DIAGNOSIS — Z8673 Personal history of transient ischemic attack (TIA), and cerebral infarction without residual deficits: Secondary | ICD-10-CM | POA: Insufficient documentation

## 2017-09-10 DIAGNOSIS — K2950 Unspecified chronic gastritis without bleeding: Secondary | ICD-10-CM | POA: Diagnosis not present

## 2017-09-10 DIAGNOSIS — F419 Anxiety disorder, unspecified: Secondary | ICD-10-CM | POA: Insufficient documentation

## 2017-09-10 DIAGNOSIS — R1310 Dysphagia, unspecified: Secondary | ICD-10-CM | POA: Diagnosis not present

## 2017-09-10 DIAGNOSIS — K319 Disease of stomach and duodenum, unspecified: Secondary | ICD-10-CM | POA: Diagnosis not present

## 2017-09-10 HISTORY — PX: ESOPHAGOGASTRODUODENOSCOPY (EGD) WITH PROPOFOL: SHX5813

## 2017-09-10 SURGERY — ESOPHAGOGASTRODUODENOSCOPY (EGD) WITH PROPOFOL
Anesthesia: General

## 2017-09-10 MED ORDER — PROPOFOL 500 MG/50ML IV EMUL
INTRAVENOUS | Status: DC | PRN
  Administered 2017-09-10: 150 ug/kg/min via INTRAVENOUS

## 2017-09-10 MED ORDER — SODIUM CHLORIDE 0.9 % IV SOLN
INTRAVENOUS | Status: DC
  Administered 2017-09-10: 1000 mL via INTRAVENOUS

## 2017-09-10 MED ORDER — LIDOCAINE HCL (PF) 1 % IJ SOLN
INTRAMUSCULAR | Status: AC
  Administered 2017-09-10: 3 mL via INTRADERMAL
  Filled 2017-09-10: qty 2

## 2017-09-10 MED ORDER — PROPOFOL 10 MG/ML IV BOLUS
INTRAVENOUS | Status: DC | PRN
  Administered 2017-09-10: 70 mg via INTRAVENOUS

## 2017-09-10 MED ORDER — LIDOCAINE HCL (CARDIAC) 20 MG/ML IV SOLN
INTRAVENOUS | Status: DC | PRN
  Administered 2017-09-10: 100 mg via INTRAVENOUS

## 2017-09-10 MED ORDER — LIDOCAINE HCL (PF) 1 % IJ SOLN
2.0000 mL | Freq: Once | INTRAMUSCULAR | Status: AC
  Administered 2017-09-10: 3 mL via INTRADERMAL

## 2017-09-10 NOTE — Anesthesia Postprocedure Evaluation (Signed)
Anesthesia Post Note  Patient: Abigail Horne  Procedure(s) Performed: ESOPHAGOGASTRODUODENOSCOPY (EGD) WITH PROPOFOL (N/A )  Patient location during evaluation: PACU Anesthesia Type: General Level of consciousness: awake and alert Pain management: pain level controlled Vital Signs Assessment: post-procedure vital signs reviewed and stable Respiratory status: spontaneous breathing and respiratory function stable Cardiovascular status: stable Anesthetic complications: no     Last Vitals:  Vitals:   09/10/17 1312 09/10/17 1322  BP: 137/86 138/68  Pulse: (!) 103 83  Resp: (!) 22 20  Temp: 36.4 C   SpO2: 96% 99%    Last Pain:  Vitals:   09/10/17 1312  TempSrc: Tympanic                 [name] K

## 2017-09-10 NOTE — H&P (Signed)
Outpatient short stay form Pre-procedure 09/10/2017 12:08 PM Abigail Horne, M.D.  Primary Physician: [place] Pink, M.D.  Reason for visit:  Dysphagia, increased GERD   History of present illness:  Pt with increased GERD with weight gain. Intermittent solid food dysphagia to the level of the sternum. Recent barium swallow revealed some esophageal dysmotility along with a hiatal hernia that prevented a 57mm barium tablet from passing temporarily.    No current facility-administered medications for this encounter.   Prescriptions Prior to Admission  Medication Sig Dispense Refill Last Dose  . augmented betamethasone dipropionate (DIPROLENE-AF) 0.05 % ointment APP EXT AA BID  1 Taking  . Cholecalciferol (VITAMIN D-1000 MAX ST) 1000 units tablet Take by mouth.   Taking  . enoxaparin (LOVENOX) 40 MG/0.4ML injection Inject 0.4 mLs (40 mg total) into the skin daily. 14 Syringe 0   . estradiol (ESTRACE) 0.1 MG/GM vaginal cream Place 1 Applicatorful vaginally at bedtime.   Taking  . FLUZONE HIGH-DOSE 0.5 ML SUSY    Taking  . furosemide (LASIX) 20 MG tablet Take 20 mg by mouth daily.    07/22/2016 at Unknown time  . gabapentin (NEURONTIN) 300 MG capsule   0 Taking  . lisinopril (PRINIVIL,ZESTRIL) 10 MG tablet    Taking  . lisinopril-hydrochlorothiazide (PRINZIDE,ZESTORETIC) 20-25 MG per tablet Take 1 tablet by mouth daily.   Taking  . meloxicam (MOBIC) 15 MG tablet    Taking  . metoprolol succinate (TOPROL-XL) 50 MG 24 hr tablet Take 50 mg by mouth daily. Take with or immediately following a meal.   09/10/2017 at 0700  . Multiple Vitamins-Minerals (MULTIVITAMIN ADULT PO) Take by mouth.   Taking  . Multiple Vitamins-Minerals (PRESERVISION/LUTEIN PO) Take 1 tablet by mouth daily.    Taking  . mupirocin ointment (BACTROBAN) 2 %    Taking  . naproxen sodium (ANAPROX) 220 MG tablet Take by mouth.   Taking  . omeprazole (PRILOSEC) 40 MG capsule    Taking  . oxymetazoline (AFRIN) 0.05 % nasal spray  Place into the nose.   Taking  . sertraline (ZOLOFT) 100 MG tablet TAKE 1 AND 1/2 TABLETS(150 MG) BY MOUTH EVERY DAY   Taking  . sodium chloride (OCEAN) 0.65 % SOLN nasal spray Place 1 spray into both nostrils daily as needed for congestion.   Taking  . tolterodine (DETROL LA) 2 MG 24 hr capsule Take 1 capsule (2 mg total) by mouth daily. 30 capsule 3 Taking  . traMADol (ULTRAM) 50 MG tablet Take 1 tablet (50 mg total) by mouth every 6 (six) hours as needed. 30 tablet 0 Taking  . triamcinolone cream (KENALOG) 0.5 % Apply topically.   Taking  . Turmeric Curcumin 500 MG CAPS Take by mouth.   Taking  . valACYclovir (VALTREX) 1000 MG tablet   0 Taking  . vitamin B-12 (CYANOCOBALAMIN) 1000 MCG tablet Take by mouth.   Taking     Allergies  Allergen Reactions  . Contrast Media [Iodinated Diagnostic Agents] Other (See Comments) and Anaphylaxis    irregular heartbeat. irregular heartbeat.     Past Medical History:  Diagnosis Date  . Anxiety   . Arthritis   . Atrophic vaginitis   . BIH (benign intracranial hypertension)   . Bladder calculus   . Bladder prolapse, female, acquired   . Cervix abnormality   . Chronic cystitis   . Cirrhosis ([place])   . Cystocele   . GERD (gastroesophageal reflux disease)   . Hx-TIA (transient ischemic attack)   .  Hyperlipidemia   . Hypertension   . Liver disease   . Nocturia   . Over weight   . Seasonal allergies   . Stress incontinence   . Trigonitis   . Urethral caruncle   . Urethral stricture   . Urinary urgency     Review of systems:      Physical Exam  General appearance: alert, cooperative and appears stated age Resp: clear to auscultation bilaterally Cardio: regular rate and rhythm, S1, S2 normal, no murmur, click, rub or gallop GI: soft, non-tender; bowel sounds normal; no masses,  no organomegaly Extremities: extremities normal, atraumatic, no cyanosis or edema     Planned procedures: Proceed with EGD. The patient understands the  nature of the planned procedure, indications, risks, alternatives and potential complications including but not limited to bleeding, infection, perforation, damage to internal organs and possible oversedation/side effects from anesthesia. The patient agrees and gives consent to proceed.  Please refer to procedure notes for findings, recommendations and patient disposition/instructions.    Abigail Horne, M.D. Gastroenterology 09/10/2017  12:08 PM

## 2017-09-10 NOTE — Anesthesia Preprocedure Evaluation (Signed)
Anesthesia Evaluation  Patient identified by MRN, date of birth, ID band Patient awake    Reviewed: Allergy & Precautions, NPO status , Patient's Chart, lab work & pertinent test results  History of Anesthesia Complications Negative for: history of anesthetic complications  Airway Mallampati: III       Dental   Pulmonary neg sleep apnea, neg COPD,           Cardiovascular hypertension, Pt. on medications and Pt. on home beta blockers (-) Past MI and (-) CHF (-) dysrhythmias (-) Valvular Problems/Murmurs     Neuro/Psych neg Seizures Anxiety    GI/Hepatic Neg liver ROS, GERD  Medicated and Controlled,  Endo/Other  neg diabetes  Renal/GU negative Renal ROS     Musculoskeletal   Abdominal   Peds  Hematology   Anesthesia Other Findings   Reproductive/Obstetrics                             Anesthesia Physical Anesthesia Plan  ASA: II  Anesthesia Plan: General   Post-op Pain Management:    Induction:   PONV Risk Score and Plan: Propofol infusion  Airway Management Planned:   Additional Equipment:   Intra-op Plan:   Post-operative Plan:   Informed Consent: I have reviewed the patients History and Physical, chart, labs and discussed the procedure including the risks, benefits and alternatives for the proposed anesthesia with the patient or authorized representative who has indicated his/her understanding and acceptance.     Plan Discussed with:   Anesthesia Plan Comments:         Anesthesia Quick Evaluation

## 2017-09-10 NOTE — Anesthesia Procedure Notes (Signed)
Date/Time: 09/10/2017 12:54 PM Performed by: Nelda Marseille Pre-anesthesia Checklist: Patient identified, Emergency Drugs available, Suction available, Patient being monitored and Timeout performed Oxygen Delivery Method: Nasal cannula

## 2017-09-10 NOTE — Transfer of Care (Signed)
Immediate Anesthesia Transfer of Care Note  Patient: Abigail Horne  Procedure(s) Performed: ESOPHAGOGASTRODUODENOSCOPY (EGD) WITH PROPOFOL (N/A )  Patient Location: PACU  Anesthesia Type:General  Level of Consciousness: awake, alert  and oriented  Airway & Oxygen Therapy: Patient Spontanous Breathing and Patient connected to nasal cannula oxygen  Post-op Assessment: Report given to RN and Post -op Vital signs reviewed and stable  Post vital signs: Reviewed and stable  Last Vitals:  Vitals:   09/10/17 1209  BP: (!) 189/83  Pulse: 97  Temp: 36.9 C  SpO2: 97%    Last Pain:  Vitals:   09/10/17 1209  TempSrc: Tympanic         Complications: No apparent anesthesia complications

## 2017-09-10 NOTE — Op Note (Signed)
[facility] Gastroenterology Patient Name: Abigail Horne Procedure Date: 09/10/2017 12:51 PM MRN: [id number] Account #: [id number] Date of Birth: [birth year] Admit Type: Outpatient Age: 71 Room: [facility] ENDO ROOM 3 Gender: Female Note Status: Finalized Procedure:            Upper GI endoscopy Indications:          Oropharyngeal phase dysphagia, Esophageal dysphagia,                        Abnormal UGI series Providers:            Benay Pike. Alice Reichert MD, MD Referring MD:         Irven Easterly. Kary Kos, MD (Referring MD) Medicines:            Propofol per Anesthesia Complications:        No immediate complications. Procedure:            Pre-Anesthesia Assessment:                       - The risks and benefits of the procedure and the                        sedation options and risks were discussed with the                        patient. All questions were answered and informed                        consent was obtained.                       - Patient identification and proposed procedure were                        verified prior to the procedure by the nurse. The                        procedure was verified in the endoscopy suite.                       - ASA Grade Assessment: III - A patient with severe                        systemic disease.                       - After reviewing the risks and benefits, the patient                        was deemed in satisfactory condition to undergo the                        procedure.                       After obtaining informed consent, the endoscope was                        passed under direct vision. Throughout the procedure,  the patient's blood pressure, pulse, and oxygen                        saturations were monitored continuously. The Endoscope                        was introduced through the mouth, and advanced to the                        third part of duodenum. Findings:      No endoscopic  abnormality was evident in the esophagus to explain the       patient's complaint of dysphagia. Biopsies were obtained from the       proximal and distal esophagus with cold forceps for histology of       suspected eosinophilic esophagitis. The lesion was not amenable to       dilation, and this was not attempted.      Diffuse mildly erythematous mucosa with bleeding on contact was found in       the entire examined stomach.      The cardia and gastric fundus were normal on retroflexion.      The examined duodenum was normal. Impression:           - No endoscopic esophageal abnormality to explain                        patient's dysphagia. Biopsied. Lesion not amenable to                        dilation, and not attempted.                       - Erythematous mucosa in the stomach.                       - Normal examined duodenum. Recommendation:       - Patient has a contact number available for                        emergencies. The signs and symptoms of potential                        delayed complications were discussed with the patient.                        Return to normal activities tomorrow. Written discharge                        instructions were provided to the patient.                       - Resume previous diet.                       - Continue present medications.                       - Await pathology results.                       - Follow an antireflux regimen.                       -  Return to GI office in 3 months.                       - Telephone GI clinic for pathology results in 1 week. Procedure Code(s):    --- Professional ---                       [phone], Esophagogastroduodenoscopy, flexible, transoral;                        with biopsy, single or multiple Diagnosis Code(s):    --- Professional ---                       K31.89, Other diseases of stomach and duodenum                       R13.12, Dysphagia, oropharyngeal phase                        R13.14, Dysphagia, pharyngoesophageal phase                       R93.3, Abnormal findings on diagnostic imaging of other                        parts of digestive tract CPT copyright 2016 [employer]. All rights reserved. The codes documented in this report are preliminary and upon coder review may  be revised to meet current compliance requirements. Efrain Sella MD, MD 09/10/2017 1:09:28 PM This report has been signed electronically. Number of Addenda: 0 Note Initiated On: 09/10/2017 12:51 PM      [facility]

## 2017-09-10 NOTE — Anesthesia Post-op Follow-up Note (Signed)
Anesthesia QCDR form completed.        

## 2017-09-11 ENCOUNTER — Encounter: Payer: Self-pay | Admitting: Internal Medicine

## 2017-09-11 LAB — SURGICAL PATHOLOGY

## 2017-09-16 DIAGNOSIS — I10 Essential (primary) hypertension: Secondary | ICD-10-CM | POA: Diagnosis not present

## 2017-09-23 DIAGNOSIS — R2689 Other abnormalities of gait and mobility: Secondary | ICD-10-CM | POA: Diagnosis not present

## 2017-09-23 DIAGNOSIS — I10 Essential (primary) hypertension: Secondary | ICD-10-CM | POA: Diagnosis not present

## 2017-09-23 DIAGNOSIS — Z23 Encounter for immunization: Secondary | ICD-10-CM | POA: Diagnosis not present

## 2017-09-23 DIAGNOSIS — R296 Repeated falls: Secondary | ICD-10-CM | POA: Diagnosis not present

## 2017-09-23 DIAGNOSIS — Z0000 Encounter for general adult medical examination without abnormal findings: Secondary | ICD-10-CM | POA: Diagnosis not present

## 2017-09-23 DIAGNOSIS — R51 Headache: Secondary | ICD-10-CM | POA: Diagnosis not present

## 2017-09-23 DIAGNOSIS — R21 Rash and other nonspecific skin eruption: Secondary | ICD-10-CM | POA: Diagnosis not present

## 2017-10-02 ENCOUNTER — Encounter: Payer: PPO | Admitting: Obstetrics and Gynecology

## 2017-10-06 ENCOUNTER — Encounter: Payer: Self-pay | Admitting: Obstetrics and Gynecology

## 2017-10-06 ENCOUNTER — Ambulatory Visit: Payer: PPO | Admitting: Obstetrics and Gynecology

## 2017-10-06 VITALS — BP 160/73 | HR 74 | Ht 63.0 in | Wt 254.4 lb

## 2017-10-06 DIAGNOSIS — H5713 Ocular pain, bilateral: Secondary | ICD-10-CM | POA: Diagnosis not present

## 2017-10-06 DIAGNOSIS — Z4689 Encounter for fitting and adjustment of other specified devices: Secondary | ICD-10-CM | POA: Diagnosis not present

## 2017-10-06 DIAGNOSIS — Z87440 Personal history of urinary (tract) infections: Secondary | ICD-10-CM

## 2017-10-06 DIAGNOSIS — N8110 Cystocele, unspecified: Secondary | ICD-10-CM

## 2017-10-06 DIAGNOSIS — N3946 Mixed incontinence: Secondary | ICD-10-CM

## 2017-10-06 DIAGNOSIS — R51 Headache: Secondary | ICD-10-CM | POA: Diagnosis not present

## 2017-10-06 DIAGNOSIS — S0590XA Unspecified injury of unspecified eye and orbit, initial encounter: Secondary | ICD-10-CM | POA: Diagnosis not present

## 2017-10-06 LAB — POCT URINALYSIS DIPSTICK
Bilirubin, UA: NEGATIVE
GLUCOSE UA: NEGATIVE
Ketones, UA: NEGATIVE
Leukocytes, UA: NEGATIVE
Nitrite, UA: NEGATIVE
PROTEIN UA: NEGATIVE
RBC UA: NEGATIVE
SPEC GRAV UA: 1.025 (ref 1.010–1.025)
UROBILINOGEN UA: 0.2 U/dL
pH, UA: 6 (ref 5.0–8.0)

## 2017-10-06 MED ORDER — TOLTERODINE TARTRATE ER 4 MG PO CP24: 4.0000 mg | ORAL_CAPSULE | Freq: Every day | ORAL | 11 refills | Status: DC

## 2017-10-06 NOTE — Progress Notes (Signed)
    GYNECOLOGY PROGRESS NOTE  Subjective:    Patient ID: Abigail Horne, female    DOB: [birth year], 71 y.o.   MRN: [id number]  HPI  Patient is a 71 y.o. G44P2002 female who presents for complaints of pessary possibly moving out of place.  Notes that one day last week she felt some discomfort that lasted for ~ 1-2 seconds and then resolved.  Since that time, she has been experiencing excessive urination, sometimes soaking a pad.  Notes that the urge comes on without any prior warning. Pessary in place for cystocele (Grade 2), recurrent UTI's, and mixed urinary incontinence.  Reports no vaginal bleeding or discharge. She denies pelvic discomfort and difficulty urinating or moving her bowels. Is on Detrol for OAB symptoms.   The following portions of the patient's history were reviewed and updated as appropriate: allergies, current medications, past family history, past medical history, past social history, past surgical history and problem list.  Review of Systems Pertinent items noted in HPI and remainder of comprehensive ROS otherwise negative.   Objective:   Blood pressure (!) 160/73, pulse 74, height 5\' 3"  (1.6 m), weight 254 lb 6.4 oz (115.4 kg). Body mass index is 45.06 kg/m.  General appearance: alert and no distress Pelvis: Speculum examination revealed normal vaginal mucosa with no lesions or lacerations.  Size 1 ring with support pessary removed, cleaned, and replaced without difficulty.    Labs:  Results for orders placed or performed in visit on 10/06/17  POCT urinalysis dipstick  Result Value Ref Range   Color, UA yellow    Clarity, UA clear    Glucose, UA neg    Bilirubin, UA neg    Ketones, UA neg    Spec Grav, UA 1.025 1.010 - 1.025   Blood, UA neg    pH, UA 6.0 5.0 - 8.0   Protein, UA neg    Urobilinogen, UA 0.2 0.2 or 1.0 E.U./dL   Nitrite, UA neg    Leukocytes, UA Negative Negative    Assessment:  Encounter for pessary maintenance Cystocele (Grade 2)  H/o  recurrent UTI's Mixed urinary incontinence Morbid obesity  Plan:  Pessary reinserted today.  Noted that her pessary is sitting lower today in the vagina than usual.  Is possible that her cystocele is worsening, or is experiencing vaginal laxity of tissues and may need to change size of pessary.  Advised that if she continues to experience discomfort or shifting of the pessary, we will need to order the next size up.  The patient should return in 10-12 weeks for a pessary check and continue to use Trimo-San gelweekly as prescribed.  Follow up sooner if symptoms persist.  UA today negative for UTI Continue Detrol as prescribed.  Will increase dose to 4 mg from 2 mg due to her worsening symptoms.  Continued to encourage patient to engage in weight bearing exercises as possible.     Rubie Maid, MD [facility]

## 2017-10-07 DIAGNOSIS — R262 Difficulty in walking, not elsewhere classified: Secondary | ICD-10-CM | POA: Diagnosis not present

## 2017-10-09 DIAGNOSIS — I872 Venous insufficiency (chronic) (peripheral): Secondary | ICD-10-CM | POA: Diagnosis not present

## 2017-10-09 DIAGNOSIS — I890 Lymphedema, not elsewhere classified: Secondary | ICD-10-CM | POA: Diagnosis not present

## 2017-10-20 DIAGNOSIS — I872 Venous insufficiency (chronic) (peripheral): Secondary | ICD-10-CM | POA: Diagnosis not present

## 2017-10-27 DIAGNOSIS — I872 Venous insufficiency (chronic) (peripheral): Secondary | ICD-10-CM | POA: Diagnosis not present

## 2017-10-27 DIAGNOSIS — I890 Lymphedema, not elsewhere classified: Secondary | ICD-10-CM | POA: Diagnosis not present

## 2017-11-27 ENCOUNTER — Encounter: Payer: PPO | Admitting: Obstetrics and Gynecology

## 2017-12-30 ENCOUNTER — Ambulatory Visit (INDEPENDENT_AMBULATORY_CARE_PROVIDER_SITE_OTHER): Payer: Self-pay | Admitting: Obstetrics and Gynecology

## 2017-12-30 ENCOUNTER — Encounter: Payer: Self-pay | Admitting: Obstetrics and Gynecology

## 2017-12-30 VITALS — BP 140/72 | HR 73 | Wt 256.8 lb

## 2017-12-30 DIAGNOSIS — Z4689 Encounter for fitting and adjustment of other specified devices: Secondary | ICD-10-CM

## 2017-12-30 DIAGNOSIS — Z87440 Personal history of urinary (tract) infections: Secondary | ICD-10-CM

## 2017-12-30 DIAGNOSIS — N3946 Mixed incontinence: Secondary | ICD-10-CM

## 2017-12-30 DIAGNOSIS — N8110 Cystocele, unspecified: Secondary | ICD-10-CM

## 2017-12-30 MED ORDER — MIRABEGRON ER 25 MG PO TB24: 25.0000 mg | ORAL_TABLET | Freq: Every day | ORAL | 11 refills | Status: DC

## 2017-12-30 NOTE — Progress Notes (Signed)
    GYNECOLOGY PROGRESS NOTE  Subjective:    Patient ID: Glenda Chroman, female    DOB: [birth year], 72 y.o.   MRN: [id number]  HPI  Patient is a 72 y.o. G58P2002 female who presents for pessary check.  She currently has a pessary in place. Pessary in place for cystocele (Grade 2), recurrent UTI's, and mixed urinary incontinence.  Reports no vaginal bleeding or discharge. She denies pelvic discomfort and difficulty urinating or moving her bowels. Is on Detrol for OAB symptoms, however is still noting some urge symptoms.  States that she is still urinating somewhat frequently and has occasional episodes of leakage.   The following portions of the patient's history were reviewed and updated as appropriate: allergies, current medications, past family history, past medical history, past social history, past surgical history and problem list.  Review of Systems Pertinent items noted in HPI and remainder of comprehensive ROS otherwise negative.   Objective:   Blood pressure 140/72, pulse 73, weight 256 lb 12.8 oz (116.5 kg). Body mass index is 45.49 kg/m.  General appearance: alert and no distress Pelvis: Speculum examination revealed normal vaginal mucosa with no lesions or lacerations.  Size 1 ring with support pessary removed, cleaned, and replaced without difficulty.   Assessment:  Encounter for pessary maintenance Cystocele (Grade 2)  H/o recurrent UTI's Mixed urinary incontinence Morbid obesity  Plan:   - Pessary reinserted today. The patient should return in 3-4 months for a pessary check and continue to use Trimo-San gelweekly as prescribed.   - Continue Detrol as prescribed. Will change from Detrol to Myrbetric to help better control urge symptoms.  Discussed with patient that she will continue to experience some increased frequency and possible leakage secondary to patient also taking diuretics daily.  Advised that these 2 medications (diuretics and OAB meds) are likely  competing.Discussed realistic expectations regarding symptoms. - Continued to encourage patient to engage in weight bearing exercises as possible.     Rubie Maid, MD [facility]

## 2018-01-01 ENCOUNTER — Ambulatory Visit: Payer: PPO | Admitting: Urology

## 2018-01-07 ENCOUNTER — Ambulatory Visit: Payer: PPO | Admitting: Urology

## 2018-01-27 ENCOUNTER — Encounter: Payer: Self-pay | Admitting: Obstetrics and Gynecology

## 2018-01-28 ENCOUNTER — Encounter: Payer: Self-pay | Admitting: Obstetrics and Gynecology

## 2018-01-30 ENCOUNTER — Encounter: Payer: Self-pay | Admitting: Obstetrics and Gynecology

## 2018-02-11 ENCOUNTER — Ambulatory Visit: Payer: Self-pay | Admitting: Urology

## 2018-03-11 ENCOUNTER — Ambulatory Visit: Payer: Self-pay | Admitting: Urology

## 2018-03-23 DIAGNOSIS — R600 Localized edema: Secondary | ICD-10-CM | POA: Insufficient documentation

## 2018-04-07 ENCOUNTER — Ambulatory Visit: Payer: Self-pay | Admitting: Urology

## 2018-04-08 DIAGNOSIS — I5032 Chronic diastolic (congestive) heart failure: Secondary | ICD-10-CM | POA: Insufficient documentation

## 2018-04-13 IMAGING — RF DG ESOPHAGUS
11 series · 14 of 15 positions shown · non-contrast
Comparison: None.

CLINICAL DATA: Dysphagia with liquid and solid material

EXAM:
ESOPHOGRAM / BARIUM SWALLOW / BARIUM TABLET STUDY
TECHNIQUE: Combined double contrast and single contrast examination performed
using thick liquid barium and thin barium liquid. The patient was
observed with fluoroscopy swallowing a 13 mm barium sulphate tablet.
FLUOROSCOPY TIME:  Fluoroscopy Time:  2 minutes 12 seconds
Radiation Exposure Index (if provided by the fluoroscopic device):
8.30 mGy
Number of Acquired Spot Images: 17

[Series 1: fluoro_barium 2fps_bw · 0.18mm/px · 3 of 4 frames shown (1 of 11)]
[frame 1/4]
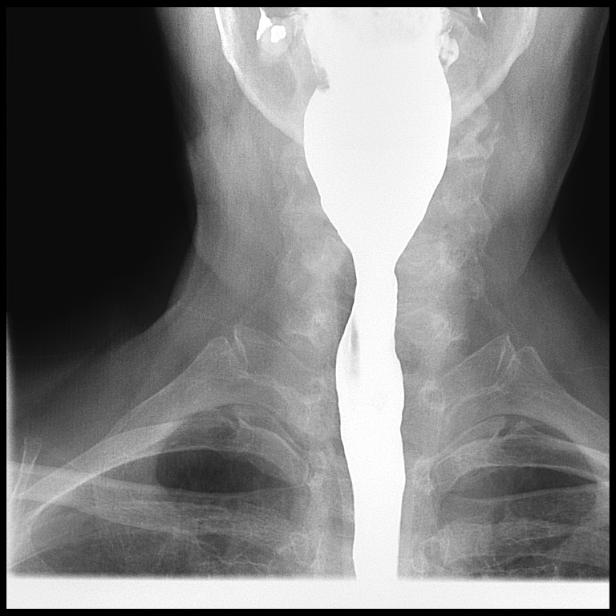
[frame 3/4]
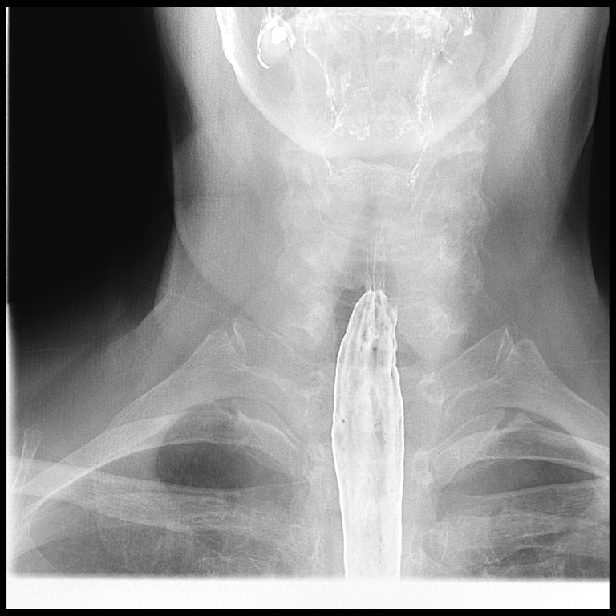
[frame 4/4]
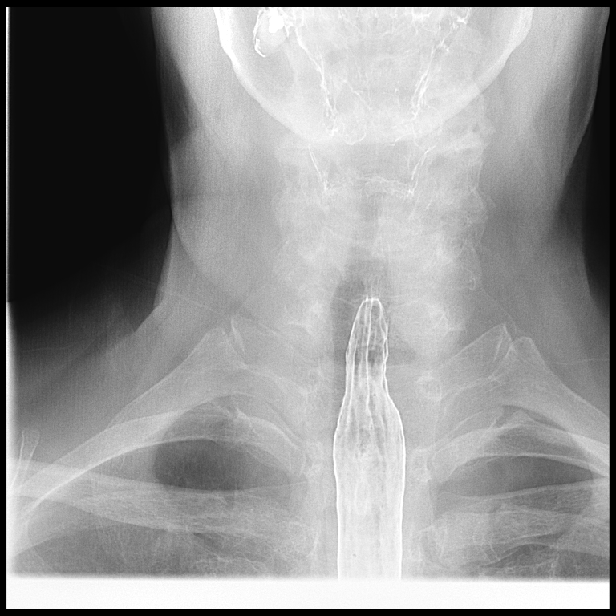

[Series 2: fluoro_barium 2fps_bw · 0.18mm/px · 1 of 1 slices shown (2 of 11)]
[im 1/1]
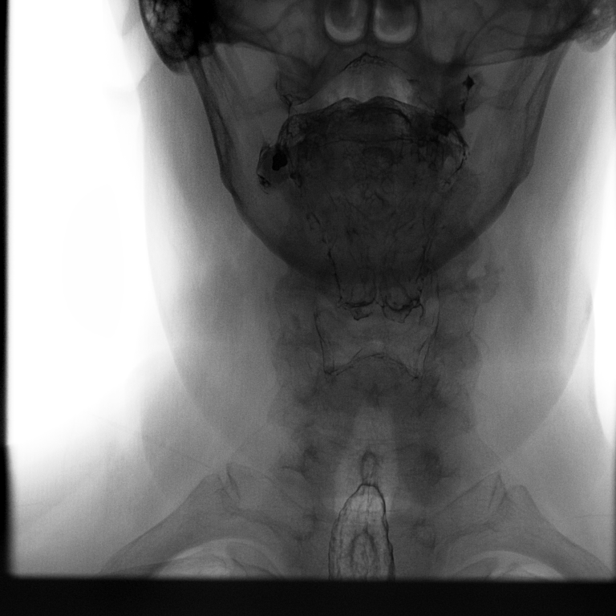

[Series 3: fluoro_barium 2fps_bw · 0.18mm/px · 1 of 1 slices shown (3 of 11)]
[im 1/1]
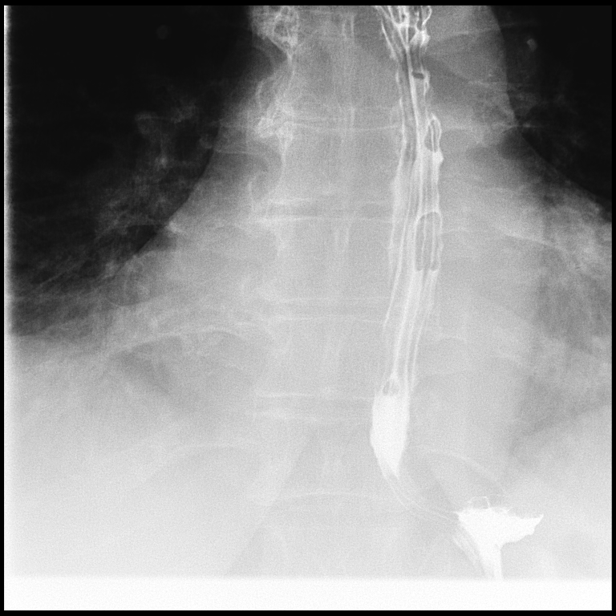

[Series 4: fluoro_barium 2fps_bw · 0.18mm/px · 2 of 10 frames shown (4 of 11)]
[frame 2/10]
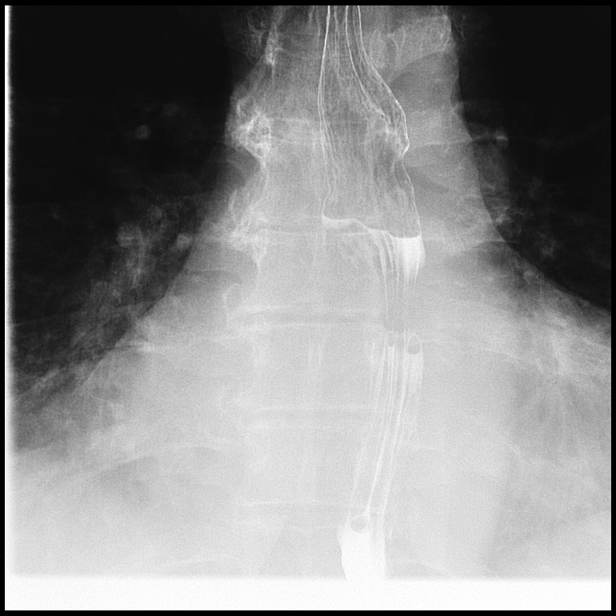
[frame 6/10]
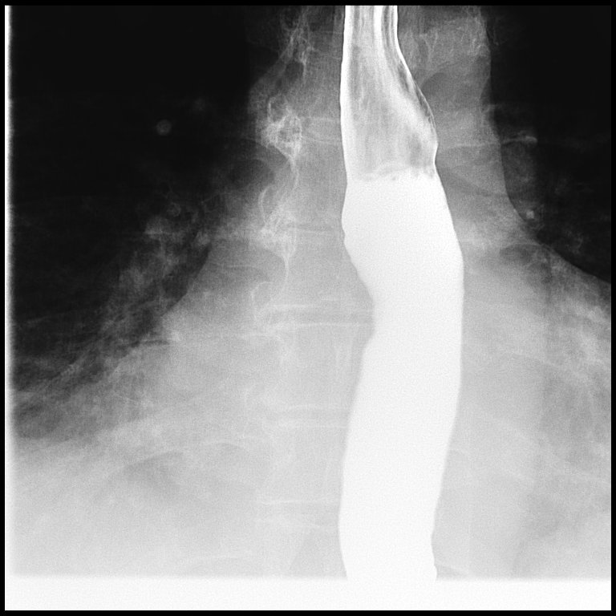

[Series 5: fluoro_barium 2fps_bw · 0.19mm/px · 1 of 1 slices shown (5 of 11)]
[im 1/1]
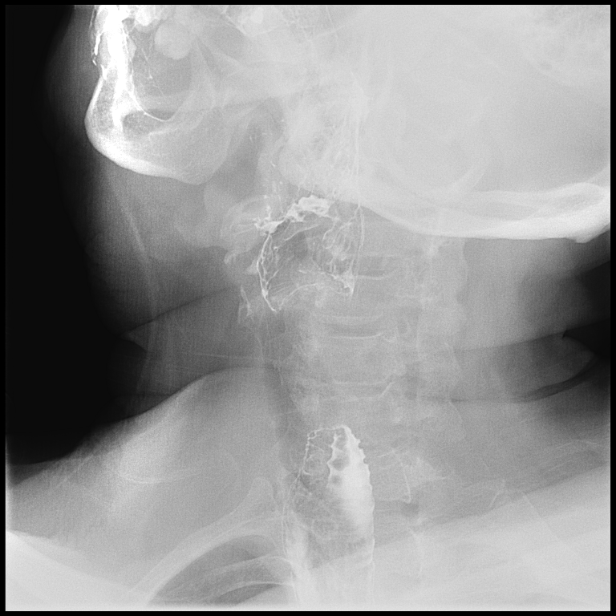

[Series 6: fluoro_barium 2fps_bw · 0.19mm/px · 1 of 1 slices shown (6 of 11)]
[im 1/1]
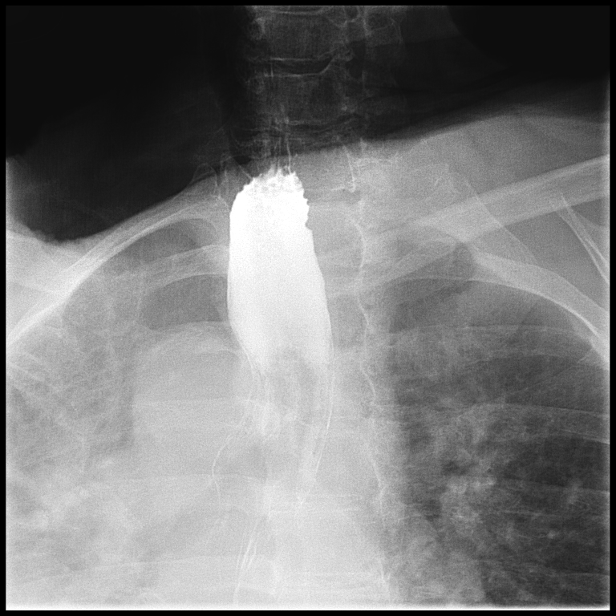

[Series 7: fluoro_barium 2fps_bw · 0.19mm/px · 1 of 1 slices shown (7 of 11)]
[im 1/1]
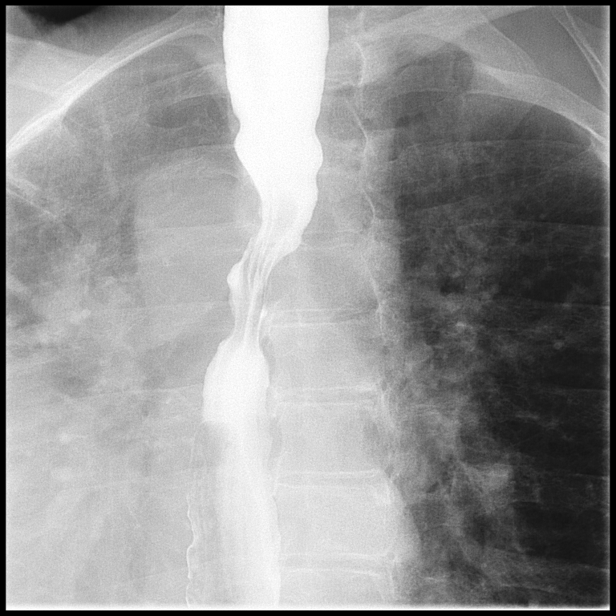

[Series 8: fluoro_barium 2fps_bw · 0.19mm/px · 1 of 1 slices shown (8 of 11)]
[im 1/1]
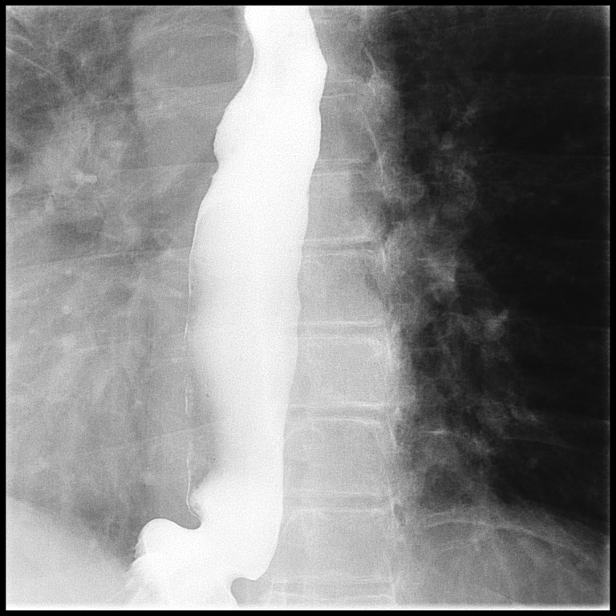

[Series 9: fluoro_barium 2fps_bw · 0.19mm/px · 1 of 1 slices shown (9 of 11)]
[im 1/1]
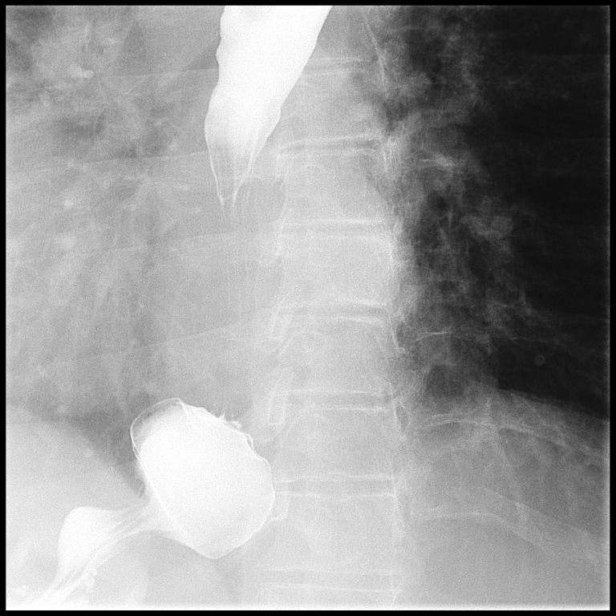

[Series 10: fluoro_barium 2fps_bw · 0.19mm/px · 1 of 1 slices shown (10 of 11)]
[im 1/1]
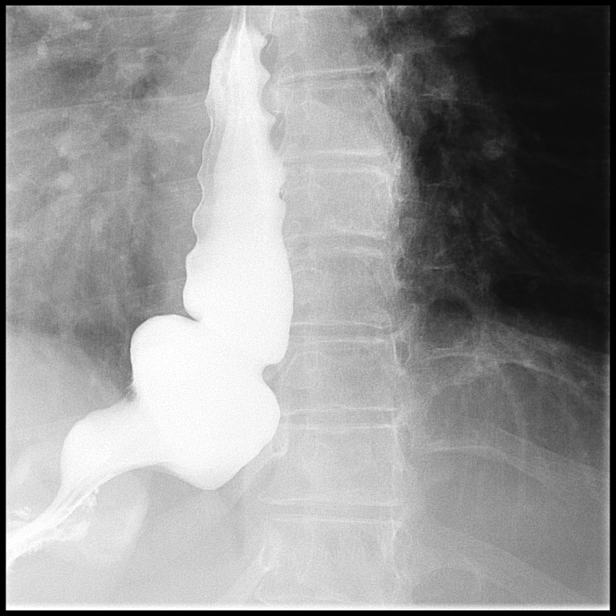

[Series 11: fluoro_barium 2fps_bw · 0.18mm/px · 1 of 1 slices shown (11 of 11)]
[im 1/1]
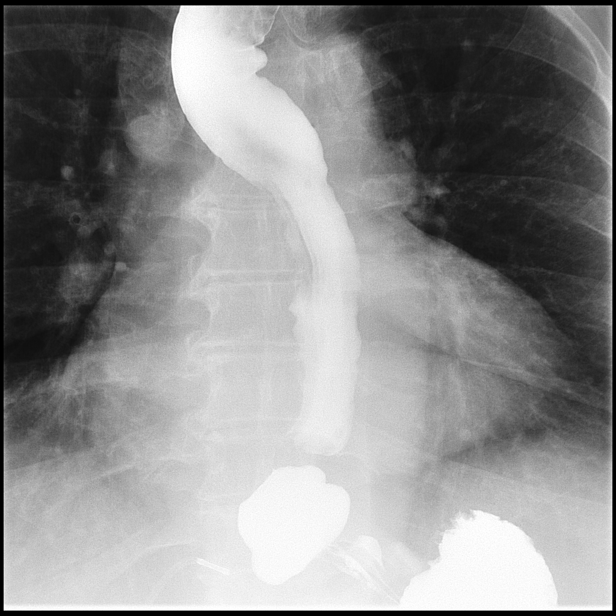

[14 of 15 positions shown; findings below may reference images not displayed]

FINDINGS: The patient initiates deglutition without appreciable difficulty.
There is moderate esophageal dysmotility with multiple tertiary
contractions.

There is a focal hiatal hernia with reflux of a full, barium to the
upper esophagus which is slowed empty. There is no appreciable
esophageal mass, stricture, or ulceration. Pharynx appears
unremarkable.

13 mm barium tablet passed freely to the hiatal hernia. The tablet
remained in the hiatal hernia for approximately 1 minute, passing
intact into the remainder the stomach after several boluses of
water.
IMPRESSION: Moderate esophageal dysmotility with liquid material. 13 mm barium
tablet passed freely to the hiatal hernia but required several
boluses of water to pass through the hiatal hernia into the
remainder the stomach.

Small hiatal hernia with diffuse reflux which is slowed empty.

No esophageal stricture, mass, or ulceration. No pharyngeal lesion
evident.

## 2018-05-05 ENCOUNTER — Ambulatory Visit: Payer: Self-pay | Admitting: Urology

## 2018-05-07 ENCOUNTER — Encounter: Payer: Self-pay | Admitting: *Deleted

## 2018-05-11 NOTE — Progress Notes (Signed)
05/12/2018 10:28 AM   Abigail Horne 1946/01/18 [id number]  Referring provider: [place] Pink, MD [address] [facility] [place], [place] [address]  Chief Complaint  Patient presents with  . Follow-up    Atrophic vaginitis    HPI: Patient is a 72 year old Caucasian female with a cystocele, history of recurrent UTI's and atrophic vaginitis who presents today for a yearly recheck.  Cystocele Was last seen by Dr. Marcelline Mates in 12/2017.  She still has the pessary in place.  Removed and cleaned today.  Advised patient to follow up with Dr. Marcelline Mates.   History of recurrent UTI's + Enterobacter cloacae with variable resistance pattern in 04/2017 + Klebsiella pneumoniae resistant to ampicillin in 08/2017  Risk factors for UTI's: age, incontinence, vaginal atrophy and constipation   Patient denies any gross hematuria, dysuria or suprapubic/flank pain.  Patient denies any fevers, chills, nausea or vomiting.   Atrophic vaginitis She is using her vaginal estrogen cream twice weekly.    Mixed incontinence Currently on Detrol LA 4 mg daily.  The patient has been experiencing urgency x 4-7, frequency x 4-7, not restricting fluids to avoid visits to the restroom, is engaging in toilet mapping, incontinence x 4-7 and nocturia x 0-3.  BP is 162/68.  PVR is 11 mL.  She is complaining of frequency, nocturia, incontinence and intermittency.  She states she is soaking her pad at night.    PMH: Past Medical History:  Diagnosis Date  . Anxiety   . Arthritis   . Atrophic vaginitis   . BIH (benign intracranial hypertension)   . Bladder calculus   . Bladder prolapse, female, acquired   . Cervix abnormality   . Chronic cystitis   . Cirrhosis ([place])   . Cystocele   . GERD (gastroesophageal reflux disease)   . Hx-TIA (transient ischemic attack)   . Hyperlipidemia   . Hypertension   . Liver disease   . Nocturia   . Over weight   . PND (post-nasal drip)   . Seasonal allergies   .  Stress incontinence   . Stroke ([place])   . Trigonitis   . Urethral caruncle   . Urethral stricture   . Urinary urgency     Surgical History: Past Surgical History:  Procedure Laterality Date  . APPENDECTOMY  1955  . arthroscopic knee surgery Left 12/2014   meniscus  . BLADDER SURGERY  1999   tack  . ESOPHAGOGASTRODUODENOSCOPY (EGD) WITH PROPOFOL N/A 09/10/2017   Procedure: ESOPHAGOGASTRODUODENOSCOPY (EGD) WITH PROPOFOL;  Surgeon: Toledo, Benay Pike, MD;  Location: [facility] ENDOSCOPY;  Service: Gastroenterology;  Laterality: N/A;  . GASTRIC BYPASS     lap band  . JOINT REPLACEMENT Left 04/2017  . TONSILLECTOMY    . TOTAL KNEE ARTHROPLASTY Left 07/23/2016   Procedure: TOTAL KNEE ARTHROPLASTY;  Surgeon: Hessie Knows, MD;  Location: [facility] ORS;  Service: Orthopedics;  Laterality: Left;  Marland Kitchen VAGINAL HYSTERECTOMY  1976    Home Medications:  Allergies as of 05/12/2018      Reactions   Contrast Media [iodinated Diagnostic Agents] Anaphylaxis, Other (See Comments)   irregular heartbeat. irregular heartbeat. BETADINE OK      Medication List        Accurate as of 05/12/18 10:28 AM. Always use your most recent med list.          augmented betamethasone dipropionate 0.05 % ointment Commonly known as:  DIPROLENE-AF APP EXT AA BID   estradiol 0.1 MG/GM vaginal cream Commonly known  as:  ESTRACE Apply a pea-sized amount (0.5 mg) with fingertips to just inside the vaginal enteritis on Monday, Wednesday and Friday nights   furosemide 20 MG tablet Commonly known as:  LASIX Take 20 mg by mouth daily.   gabapentin 300 MG capsule Commonly known as:  NEURONTIN   lisinopril-hydrochlorothiazide 20-25 MG tablet Commonly known as:  PRINZIDE,ZESTORETIC Take 1 tablet by mouth daily.   meloxicam 15 MG tablet Commonly known as:  MOBIC   metoprolol succinate 50 MG 24 hr tablet Commonly known as:  TOPROL-XL Take 50 mg by mouth daily. Take with or immediately following a meal.   mupirocin  ointment 2 % Commonly known as:  BACTROBAN   naproxen sodium 220 MG tablet Commonly known as:  ALEVE Take by mouth.   omeprazole 40 MG capsule Commonly known as:  PRILOSEC   PRESERVISION/LUTEIN PO Take 1 tablet by mouth daily.   MULTIVITAMIN ADULT PO Take by mouth.   sertraline 100 MG tablet Commonly known as:  ZOLOFT TAKE 1 AND 1/2 TABLETS(150 MG) BY MOUTH EVERY DAY   tolterodine 4 MG 24 hr capsule Commonly known as:  DETROL LA Take by mouth.   tolterodine 4 MG 24 hr capsule Commonly known as:  DETROL LA Take 1 capsule (4 mg total) by mouth daily.   traMADol 50 MG tablet Commonly known as:  ULTRAM Take 1 tablet (50 mg total) by mouth every 6 (six) hours as needed.   triamcinolone cream 0.5 % Commonly known as:  KENALOG Apply topically.   Turmeric Curcumin 500 MG Caps Take by mouth.   valACYclovir 1000 MG tablet Commonly known as:  VALTREX   vitamin B-12 1000 MCG tablet Commonly known as:  CYANOCOBALAMIN Take by mouth.   VITAMIN D-1000 MAX ST 1000 units tablet Generic drug:  Cholecalciferol Take by mouth.       Allergies:  Allergies  Allergen Reactions  . Contrast Media [Iodinated Diagnostic Agents] Anaphylaxis and Other (See Comments)    irregular heartbeat. irregular heartbeat.  BETADINE OK    Family History: Family History  Problem Relation Age of Onset  . Heart attack Father   . Heart disease Father   . Alzheimer's disease Mother   . Diabetes Mother   . Diabetes Brother   . Kidney disease Neg Hx   . Bladder Cancer Neg Hx   . Cancer Neg Hx   . Breast cancer Neg Hx     Social History:  reports that she has never smoked. She has never used smokeless tobacco. She reports that she does not drink alcohol or use drugs.  ROS: UROLOGY Frequent Urination?: Yes Hard to postpone urination?: No Burning/pain with urination?: No Get up at night to urinate?: Yes Leakage of urine?: Yes Urine stream starts and stops?: Yes Trouble starting  stream?: No Do you have to strain to urinate?: No Blood in urine?: No Urinary tract infection?: Yes Sexually transmitted disease?: No Injury to kidneys or bladder?: No Painful intercourse?: No Weak stream?: No Currently pregnant?: No Vaginal bleeding?: No Last menstrual period?: n  Gastrointestinal Nausea?: No Vomiting?: No Indigestion/heartburn?: No Diarrhea?: No Constipation?: No  Constitutional Fever: No Night sweats?: No Weight loss?: No Fatigue?: No  Skin Skin rash/lesions?: No Itching?: No  Eyes Blurred vision?: No Double vision?: No  Ears/Nose/Throat Sore throat?: No Sinus problems?: Yes  Hematologic/Lymphatic Swollen glands?: No Easy bruising?: No  Cardiovascular Leg swelling?: Yes Chest pain?: No  Respiratory Cough?: No Shortness of breath?: No  Endocrine Excessive thirst?: No  Musculoskeletal Back pain?: No Joint pain?: No  Neurological Headaches?: No Dizziness?: No  Psychologic Depression?: No Anxiety?: No  Physical Exam: BP (!) 162/68 (BP Location: Right Arm, Patient Position: Sitting, Cuff Size: Normal)   Pulse 66   Ht 5\' 2"  (1.575 m)   Wt 251 lb 6.4 oz (114 kg)   BMI 45.98 kg/m   Constitutional: Well nourished. Alert and oriented, No acute distress. HEENT: [place] AT, moist mucus membranes. Trachea midline, no masses. Cardiovascular: No clubbing, cyanosis, or edema. Respiratory: Normal respiratory effort, no increased work of breathing. GI: Abdomen is soft, non tender, non distended, no abdominal masses. Liver and spleen not palpable.  No hernias appreciated.  Stool sample for occult testing is not indicated.   GU: No CVA tenderness.  No bladder fullness or masses.  Atrophic external genitalia, normal pubic hair distribution, no lesions.  Normal urethral meatus, no lesions, no prolapse, no discharge.   No urethral masses, tenderness and/or tenderness. No bladder fullness, tenderness or masses.  Pale vagina mucosa, good estrogen  effect, no discharge, no lesions, good pelvic support, Grade II cystocele.  No rectocele noted.  Cervix and uterus are surgically absent.  Adnexa could not be palpated due to body habitus.  Anus and perineum are without rashes or lesions.    Skin: No rashes, bruises or suspicious lesions. Lymph: No cervical or inguinal adenopathy. Neurologic: Grossly intact, no focal deficits, moving all 4 extremities. Psychiatric: Normal mood and affect.   Laboratory Data: Pertinent Imaging: Results for HERO, MCCATHERN (MRN [id number]) as of 05/12/2018 09:57  Ref. Range 05/12/2018 09:55  Scan Result Unknown 11     Assessment & Plan:    1. Cystocele:   Managed with a pessary through Dr. Andreas Blower office.  Not had it removed or cleaned since 12/2017.  Removed and cleaned today.  Advised the patient to follow-up with Dr. Marcelline Mates.    2. History of recurrent UTI Explained to patient that per ID guidelines her urine should not be checked unless she is having actual UTI symptoms that we do not screen for asymptomatic bacteriuria - will not need an UA  Asked patient to contact office for UTI symptoms -explained that the UTI symptoms consist of dysuria, gross hematuria fevers or chills.  3. Atrophic vaginitis:   She is using the cream as prescribed.  I have sent a prescription to her pharmacy.      4. Mixed Incontinence Continue Detrol LA 4 mg daily; refill given  RTC in one year for OAB questionnaire and PVR   5. Nocturia  - I explained to the patient that nocturia is often multi-factorial and difficult to treat.  Sleeping disorders, heart conditions, peripheral vascular disease, diabetes, an enlarged prostate for men, an urethral stricture causing bladder outlet obstruction and/or certain medications can contribute to nocturia.  - I have suggested that the patient avoid caffeine after noon and alcohol in the evening.  He or she may also benefit from fluid restrictions after 6:00 in the evening and voiding just  prior to bedtime.  - I have explained that research studies have showed that over 84% of patients with sleep apnea reported frequent nighttime urination.   With sleep apnea, oxygen decreases, carbon dioxide increases, the blood become more acidic, the heart rate drops and blood vessels in the lung constrict.  The body is then alerted that something is very wrong. The sleeper must wake enough to reopen the airway. By this time, the heart is racing and experiences a false  signal of fluid overload. The heart excretes a hormone-like protein that tells the body to get rid of sodium and water, resulting in nocturia.  -  I also informed the patient that a recent study noted that decreasing sodium intake to 2.3 grams daily, if they don't have issues with hyponatremia, can also reduce the number of nightly voids  - There is also an increased incidence in sleep apnea with menopause, symptoms include night sweats, daytime sleepiness, depressed mood, and cognitive complaints like poor concentration or problems with short-term memory   - The patient may benefit from a discussion with his or her primary care physician to see if he or she has risk factors for sleep apnea or other sleep disturbances and obtaining a sleep study.   Return in about 1 year (around 05/13/2019) for OAB questionnaire, PVR and exam.  These notes generated with voice recognition software. I apologize for typographical errors.  Zara Council, PA-C  [facility] [address] [place],  [address] [phone]

## 2018-05-12 ENCOUNTER — Ambulatory Visit (INDEPENDENT_AMBULATORY_CARE_PROVIDER_SITE_OTHER): Payer: Medicare HMO | Admitting: Urology

## 2018-05-12 ENCOUNTER — Encounter: Payer: Self-pay | Admitting: Urology

## 2018-05-12 VITALS — BP 162/68 | HR 66 | Ht 62.0 in | Wt 251.4 lb

## 2018-05-12 DIAGNOSIS — Z87440 Personal history of urinary (tract) infections: Secondary | ICD-10-CM

## 2018-05-12 DIAGNOSIS — N952 Postmenopausal atrophic vaginitis: Secondary | ICD-10-CM

## 2018-05-12 DIAGNOSIS — N8111 Cystocele, midline: Secondary | ICD-10-CM

## 2018-05-12 DIAGNOSIS — N3946 Mixed incontinence: Secondary | ICD-10-CM | POA: Diagnosis not present

## 2018-05-12 LAB — MICROSCOPIC EXAMINATION: Epithelial Cells (non renal): >10 /hpf — ABNORMAL HIGH (ref 0–10)

## 2018-05-12 LAB — URINALYSIS, COMPLETE
Bilirubin, UA: NEGATIVE
GLUCOSE, UA: NEGATIVE
Ketones, UA: NEGATIVE
NITRITE UA: POSITIVE — AB
PH UA: 5 (ref 5.0–7.5)
Protein, UA: NEGATIVE
Specific Gravity, UA: 1.015 (ref 1.005–1.030)
UUROB: 0.2 mg/dL (ref 0.2–1.0)

## 2018-05-12 LAB — BLADDER SCAN AMB NON-IMAGING: SCAN RESULT: 11

## 2018-05-12 MED ORDER — TOLTERODINE TARTRATE ER 4 MG PO CP24: 4.0000 mg | ORAL_CAPSULE | Freq: Every day | ORAL | 3 refills | Status: DC

## 2018-05-12 MED ORDER — ESTRADIOL 0.1 MG/GM VA CREA: TOPICAL_CREAM | VAGINAL | 11 refills | Status: AC

## 2018-05-15 ENCOUNTER — Telehealth: Payer: Self-pay

## 2018-05-15 DIAGNOSIS — N3946 Mixed incontinence: Secondary | ICD-10-CM

## 2018-05-15 MED ORDER — SOLIFENACIN SUCCINATE 10 MG PO TABS: 10.0000 mg | ORAL_TABLET | Freq: Every day | ORAL | 3 refills | Status: AC

## 2018-05-15 NOTE — Telephone Encounter (Signed)
We can send in a script for Vesicare 10 mg, # 90 with 3 refills.

## 2018-05-15 NOTE — Telephone Encounter (Signed)
RX sent

## 2018-05-15 NOTE — Telephone Encounter (Signed)
Received incoming fax from [employer] stating that Detrol LA is no longer on this pt's formulary. Insurance will cover Oxybutynin, toviaz, Muret, vericare or trospium. Please advise

## 2018-05-19 ENCOUNTER — Other Ambulatory Visit: Payer: Self-pay

## 2018-05-19 ENCOUNTER — Ambulatory Visit: Payer: Medicare HMO | Admitting: Anesthesiology

## 2018-05-19 ENCOUNTER — Encounter
Admission: RE | Disposition: A | Discharge status: Home / Self Care | Payer: Self-pay | Source: Ambulatory Visit | Attending: Ophthalmology

## 2018-05-19 ENCOUNTER — Encounter: Payer: Self-pay | Admitting: *Deleted

## 2018-05-19 ENCOUNTER — Ambulatory Visit
Admission: RE | Admit: 2018-05-19 | Discharge: 2018-05-19 | Disposition: A | Discharge status: Home / Self Care | Payer: Medicare HMO | Source: Ambulatory Visit | Attending: Ophthalmology | Admitting: Ophthalmology

## 2018-05-19 DIAGNOSIS — I739 Peripheral vascular disease, unspecified: Secondary | ICD-10-CM | POA: Diagnosis not present

## 2018-05-19 DIAGNOSIS — F419 Anxiety disorder, unspecified: Secondary | ICD-10-CM | POA: Diagnosis not present

## 2018-05-19 DIAGNOSIS — Z9884 Bariatric surgery status: Secondary | ICD-10-CM | POA: Insufficient documentation

## 2018-05-19 DIAGNOSIS — K219 Gastro-esophageal reflux disease without esophagitis: Secondary | ICD-10-CM | POA: Diagnosis not present

## 2018-05-19 DIAGNOSIS — Z79899 Other long term (current) drug therapy: Secondary | ICD-10-CM | POA: Diagnosis not present

## 2018-05-19 DIAGNOSIS — H2512 Age-related nuclear cataract, left eye: Secondary | ICD-10-CM | POA: Diagnosis present

## 2018-05-19 DIAGNOSIS — Z96652 Presence of left artificial knee joint: Secondary | ICD-10-CM | POA: Insufficient documentation

## 2018-05-19 DIAGNOSIS — Z8673 Personal history of transient ischemic attack (TIA), and cerebral infarction without residual deficits: Secondary | ICD-10-CM | POA: Diagnosis not present

## 2018-05-19 DIAGNOSIS — I110 Hypertensive heart disease with heart failure: Secondary | ICD-10-CM | POA: Diagnosis not present

## 2018-05-19 DIAGNOSIS — I509 Heart failure, unspecified: Secondary | ICD-10-CM | POA: Diagnosis not present

## 2018-05-19 HISTORY — DX: Cerebral infarction, unspecified: I63.9

## 2018-05-19 HISTORY — DX: Postnasal drip: R09.82

## 2018-05-19 HISTORY — PX: CATARACT EXTRACTION W/PHACO: SHX586

## 2018-05-19 SURGERY — PHACOEMULSIFICATION, CATARACT, WITH IOL INSERTION
Anesthesia: Monitor Anesthesia Care | Site: Eye | Laterality: Left | Wound class: "Clean "

## 2018-05-19 MED ORDER — NA CHONDROIT SULF-NA HYALURON 40-17 MG/ML IO SOLN
INTRAOCULAR | Status: AC
  Filled 2018-05-19: qty 1

## 2018-05-19 MED ORDER — NA CHONDROIT SULF-NA HYALURON 40-17 MG/ML IO SOLN
INTRAOCULAR | Status: DC | PRN
  Administered 2018-05-19: 1 mL via INTRAOCULAR

## 2018-05-19 MED ORDER — EPINEPHRINE PF 1 MG/ML IJ SOLN
INTRAMUSCULAR | Status: AC
  Filled 2018-05-19: qty 2

## 2018-05-19 MED ORDER — SODIUM CHLORIDE 0.9 % IV SOLN
INTRAVENOUS | Status: DC
  Administered 2018-05-19: 08:00:00 via INTRAVENOUS

## 2018-05-19 MED ORDER — LIDOCAINE HCL (PF) 4 % IJ SOLN
INTRAOCULAR | Status: DC | PRN
  Administered 2018-05-19: 4 mL via OPHTHALMIC

## 2018-05-19 MED ORDER — MIDAZOLAM HCL 2 MG/2ML IJ SOLN
INTRAMUSCULAR | Status: DC | PRN
  Administered 2018-05-19: 0.5 mg via INTRAVENOUS
  Administered 2018-05-19: 1 mg via INTRAVENOUS

## 2018-05-19 MED ORDER — ARMC OPHTHALMIC DILATING DROPS
1.0000 "application " | OPHTHALMIC | Status: AC
  Administered 2018-05-19: 08:00:00 via OPHTHALMIC

## 2018-05-19 MED ORDER — ARMC OPHTHALMIC DILATING DROPS
OPHTHALMIC | Status: AC
  Filled 2018-05-19: qty 0.4

## 2018-05-19 MED ORDER — EPINEPHRINE PF 1 MG/ML IJ SOLN
INTRAOCULAR | Status: DC | PRN
  Administered 2018-05-19: 10:00:00 via OPHTHALMIC

## 2018-05-19 MED ORDER — MIDAZOLAM HCL 2 MG/2ML IJ SOLN
INTRAMUSCULAR | Status: AC
  Filled 2018-05-19: qty 2

## 2018-05-19 MED ORDER — MOXIFLOXACIN HCL 0.5 % OP SOLN
OPHTHALMIC | Status: DC | PRN
  Administered 2018-05-19: 0.2 mL via OPHTHALMIC

## 2018-05-19 MED ORDER — POVIDONE-IODINE 5 % OP SOLN
OPHTHALMIC | Status: AC
  Filled 2018-05-19: qty 30

## 2018-05-19 MED ORDER — LIDOCAINE HCL (PF) 4 % IJ SOLN
INTRAMUSCULAR | Status: AC
  Filled 2018-05-19: qty 5

## 2018-05-19 MED ORDER — CARBACHOL 0.01 % IO SOLN
INTRAOCULAR | Status: DC | PRN
  Administered 2018-05-19: 0.5 mL via INTRAOCULAR

## 2018-05-19 MED ORDER — MOXIFLOXACIN HCL 0.5 % OP SOLN
OPHTHALMIC | Status: AC
  Filled 2018-05-19: qty 3

## 2018-05-19 MED ORDER — POVIDONE-IODINE 5 % OP SOLN
OPHTHALMIC | Status: DC | PRN
  Administered 2018-05-19: 1 via OPHTHALMIC

## 2018-05-19 MED ORDER — MOXIFLOXACIN HCL 0.5 % OP SOLN: 1.0000 [drp] | OPHTHALMIC | Status: DC | PRN

## 2018-05-19 SURGICAL SUPPLY — 16 items
GLOVE BIO SURGEON STRL SZ8 (GLOVE) ×2 IMPLANT
GLOVE BIOGEL M 6.5 STRL (GLOVE) ×2 IMPLANT
GLOVE SURG LX 8.0 MICRO (GLOVE) ×1
GLOVE SURG LX STRL 8.0 MICRO (GLOVE) ×1 IMPLANT
GOWN STRL REUS W/ TWL LRG LVL3 (GOWN DISPOSABLE) ×2 IMPLANT
GOWN STRL REUS W/TWL LRG LVL3 (GOWN DISPOSABLE) ×2
LABEL CATARACT MEDS ST (LABEL) ×2 IMPLANT
LENS IOL TECNIS ITEC 19.5 (Intraocular Lens) ×1 IMPLANT
PACK CATARACT (MISCELLANEOUS) ×2 IMPLANT
PACK CATARACT BRASINGTON LX (MISCELLANEOUS) ×2 IMPLANT
PACK EYE AFTER SURG (MISCELLANEOUS) ×2 IMPLANT
SOL BSS BAG (MISCELLANEOUS) ×2
SOLUTION BSS BAG (MISCELLANEOUS) ×1 IMPLANT
SYR 5ML LL (SYRINGE) ×2 IMPLANT
WATER STERILE IRR 250ML POUR (IV SOLUTION) ×2 IMPLANT
WIPE NON LINTING 3.25X3.25 (MISCELLANEOUS) ×2 IMPLANT

## 2018-05-19 NOTE — Transfer of Care (Signed)
Immediate Anesthesia Transfer of Care Note  Patient: Abigail Horne  Procedure(s) Performed: CATARACT EXTRACTION PHACO AND INTRAOCULAR LENS PLACEMENT (IOC) (Left Eye)  Patient Location: PACU  Anesthesia Type:MAC  Level of Consciousness: awake and alert   Airway & Oxygen Therapy: Patient Spontanous Breathing  Post-op Assessment: Report given to RN and Post -op Vital signs reviewed and stable  Post vital signs: Reviewed and stable  Last Vitals:  Vitals Value Taken Time  BP 159/51 05/19/2018  9:49 AM  Temp    Pulse    Resp 12 05/19/2018  9:49 AM  SpO2 98 % 05/19/2018  9:49 AM    Last Pain:  Vitals:   05/19/18 0820  TempSrc: Oral         Complications: No apparent anesthesia complications

## 2018-05-19 NOTE — Op Note (Signed)
PREOPERATIVE DIAGNOSIS:  Nuclear sclerotic cataract of the left eye.   POSTOPERATIVE DIAGNOSIS:  Nuclear sclerotic cataract of the left eye.   OPERATIVE PROCEDURE: Procedure(s): CATARACT EXTRACTION PHACO AND INTRAOCULAR LENS PLACEMENT (IOC)   SURGEON:  Birder Robson, MD.   ANESTHESIA:  Anesthesiologist: Alphonsus Sias, MD CRNA: Lance Muss, CRNA  1.      Managed anesthesia care. 2.     0.56ml of Shugarcaine was instilled following the paracentesis   COMPLICATIONS:  None.   TECHNIQUE:   Stop and chop   DESCRIPTION OF PROCEDURE:  The patient was examined and consented in the preoperative holding area where the aforementioned topical anesthesia was applied to the left eye and then brought back to the Operating Room where the left eye was prepped and draped in the usual sterile ophthalmic fashion and a lid speculum was placed. A paracentesis was created with the side port blade and the anterior chamber was filled with viscoelastic. A near clear corneal incision was performed with the steel keratome. A continuous curvilinear capsulorrhexis was performed with a cystotome followed by the capsulorrhexis forceps. Hydrodissection and hydrodelineation were carried out with BSS on a blunt cannula. The lens was removed in a stop and chop  technique and the remaining cortical material was removed with the irrigation-aspiration handpiece. The capsular bag was inflated with viscoelastic and the Technis [id number] lens was placed in the capsular bag without complication. The remaining viscoelastic was removed from the eye with the irrigation-aspiration handpiece. The wounds were hydrated. The anterior chamber was flushed with Miostat and the eye was inflated to physiologic pressure. 0.20ml Vigamox was placed in the anterior chamber. The wounds were found to be water tight. The eye was dressed with Vigamox. The patient was given protective glasses to wear throughout the day and a shield with which to sleep tonight.  The patient was also given drops with which to begin a drop regimen today and will follow-up with me in one day. Implant Name Type Inv. Item Serial No. Manufacturer Lot No. LRB No. Used  LENS IOL DIOP 19.5 - [id number] Intraocular Lens LENS IOL DIOP 19.5 [id number] AMO  Left 1    Procedure(s) with comments: CATARACT EXTRACTION PHACO AND INTRAOCULAR LENS PLACEMENT (IOC) (Left) - [place] 00:39.7 AP% 13.9 CDE 5.49 Fluid pack lot # [id number]  Electronically signed: Birder Robson 05/19/2018 9:48 AM

## 2018-05-19 NOTE — Anesthesia Preprocedure Evaluation (Addendum)
Anesthesia Evaluation  Patient identified by MRN, date of birth, ID band Patient awake    Reviewed: Allergy & Precautions, H&P , NPO status , reviewed documented beta blocker date and time   Airway Mallampati: II  TM Distance: >3 FB     Dental  (+) Chipped, Partial Upper   Pulmonary    Pulmonary exam normal        Cardiovascular hypertension, + Peripheral Vascular Disease and +CHF  Normal cardiovascular exam  EF >50%   Neuro/Psych Anxiety CVA    GI/Hepatic GERD  Controlled and Medicated,  Endo/Other    Renal/GU      Musculoskeletal  (+) Arthritis ,   Abdominal   Peds  Hematology   Anesthesia Other Findings Past Medical History: No date: Anxiety No date: Arthritis No date: Atrophic vaginitis No date: BIH (benign intracranial hypertension) No date: Bladder calculus No date: Bladder prolapse, female, acquired No date: Cervix abnormality No date: Chronic cystitis No date: Cirrhosis ([place]) No date: Cystocele No date: GERD (gastroesophageal reflux disease) No date: Hx-TIA (transient ischemic attack) No date: Hyperlipidemia No date: Hypertension No date: Liver disease No date: Nocturia No date: Over weight No date: PND (post-nasal drip) No date: Seasonal allergies No date: Stress incontinence No date: Stroke [facility]) No date: Trigonitis No date: Urethral caruncle No date: Urethral stricture No date: Urinary urgency  Past Surgical History: 1955: APPENDECTOMY 12/2014: arthroscopic knee surgery; Left     Comment:  meniscus 1999: BLADDER SURGERY     Comment:  tack 09/10/2017: ESOPHAGOGASTRODUODENOSCOPY (EGD) WITH PROPOFOL; N/A     Comment:  Procedure: ESOPHAGOGASTRODUODENOSCOPY (EGD) WITH               PROPOFOL;  Surgeon: Toledo, Benay Pike, MD;  Location:               [facility] ENDOSCOPY;  Service: Gastroenterology;  Laterality:               N/A; No date: GASTRIC BYPASS     Comment:  lap band 04/2017: JOINT  REPLACEMENT; Left No date: TONSILLECTOMY 07/23/2016: TOTAL KNEE ARTHROPLASTY; Left     Comment:  Procedure: TOTAL KNEE ARTHROPLASTY;  Surgeon: Hessie Knows, MD;  Location: [facility] ORS;  Service: Orthopedics;                Laterality: Left; 1976: VAGINAL HYSTERECTOMY  BMI    Body Mass Index:  45.73 kg/m      Reproductive/Obstetrics                            Anesthesia Physical Anesthesia Plan  ASA: III  Anesthesia Plan: MAC   Post-op Pain Management:    Induction:   PONV Risk Score and Plan: Treatment may vary due to age or medical condition and TIVA  Airway Management Planned:   Additional Equipment:   Intra-op Plan:   Post-operative Plan:   Informed Consent: I have reviewed the patients History and Physical, chart, labs and discussed the procedure including the risks, benefits and alternatives for the proposed anesthesia with the patient or authorized representative who has indicated his/her understanding and acceptance.   Dental Advisory Given  Plan Discussed with: CRNA  Anesthesia Plan Comments:         Anesthesia Quick Evaluation

## 2018-05-19 NOTE — OR Nursing (Signed)
Discharge instructions discussed with pt and friend. Both voice understanding. 

## 2018-05-19 NOTE — Anesthesia Post-op Follow-up Note (Signed)
Anesthesia QCDR form completed.        

## 2018-05-19 NOTE — H&P (Signed)
All labs reviewed. Abnormal studies sent to patients PCP when indicated.  Previous H&P reviewed, patient examined, there are NO CHANGES.  Abigail Mazzocco Porfilio6/25/20199:24 AM

## 2018-05-19 NOTE — Anesthesia Postprocedure Evaluation (Signed)
Anesthesia Post Note  Patient: Abigail Horne  Procedure(s) Performed: CATARACT EXTRACTION PHACO AND INTRAOCULAR LENS PLACEMENT (IOC) (Left Eye)  Patient location during evaluation: PACU Anesthesia Type: MAC Level of consciousness: awake and awake and alert Pain management: pain level controlled Vital Signs Assessment: post-procedure vital signs reviewed and stable Respiratory status: spontaneous breathing, nonlabored ventilation and respiratory function stable Cardiovascular status: stable Anesthetic complications: no     Last Vitals:  Vitals:   05/19/18 0820 05/19/18 0949  BP: (!) 185/65 (!) 159/51  Pulse: 67   Resp: 16 12  Temp: 37.1 C   SpO2: 98% 98%    Last Pain:  Vitals:   05/19/18 0820  TempSrc: Oral                 [employer]

## 2018-05-19 NOTE — Discharge Instructions (Signed)
Eye Surgery Discharge Instructions    Expect mild scratchy sensation or mild soreness. DO NOT RUB YOUR EYE!  The day of surgery:  Minimal physical activity, but bed rest is not required  No reading, computer work, or close hand work  No bending, lifting, or straining.  May watch TV  For 24 hours:  No driving, legal decisions, or alcoholic beverages  Safety precautions  Eat anything you prefer: It is better to start with liquids, then soup then solid foods.  _____ Eye patch should be worn until postoperative exam tomorrow.  ____ Solar shield eyeglasses should be worn for comfort in the sunlight/patch while sleeping  Resume all regular medications including aspirin or Coumadin if these were discontinued prior to surgery. You may shower, bathe, shave, or wash your hair. Tylenol may be taken for mild discomfort.  Call your doctor if you experience significant pain, nausea, or vomiting, fever > 101 or other signs of infection. [phone] or [phone] Specific instructions:  Follow-up Information    Birder Robson, MD Follow up.   Specialty:  Ophthalmology Why:  June 26 at 10:00am Contact information: [place]  [address] [phone]

## 2018-05-19 NOTE — Anesthesia Procedure Notes (Signed)
Procedure Name: MAC Performed by: Lance Muss, CRNA Pre-anesthesia Checklist: Patient identified, Emergency Drugs available, Suction available, Patient being monitored and Timeout performed Oxygen Delivery Method: Nasal cannula

## 2018-05-20 ENCOUNTER — Encounter: Payer: Self-pay | Admitting: Ophthalmology

## 2018-06-03 IMAGING — US US ABDOMEN LIMITED
1 series · 14 of 25 positions shown · non-contrast
Comparison: February 15, 2011

CLINICAL DATA: Elevated liver enzymes

EXAM:
ULTRASOUND ABDOMEN LIMITED RIGHT UPPER QUADRANT

[Series 1: us abdomen limited · 0.25mm/px · 14 of 44 slices shown]
[im 1/44]
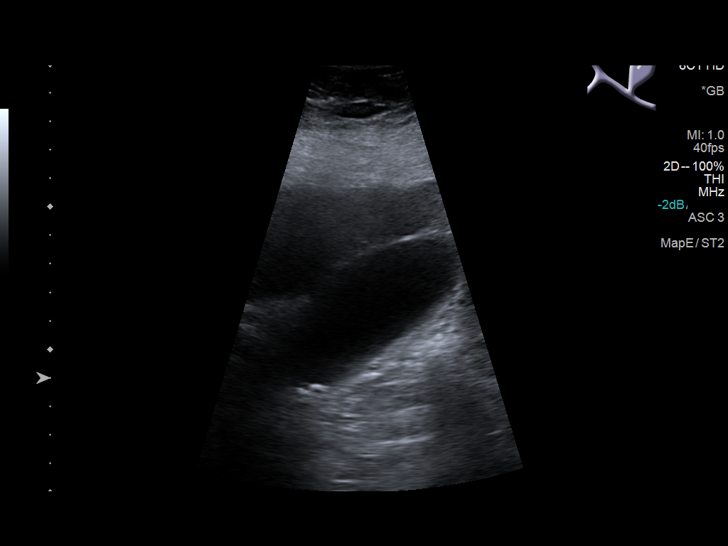
[im 4/44]
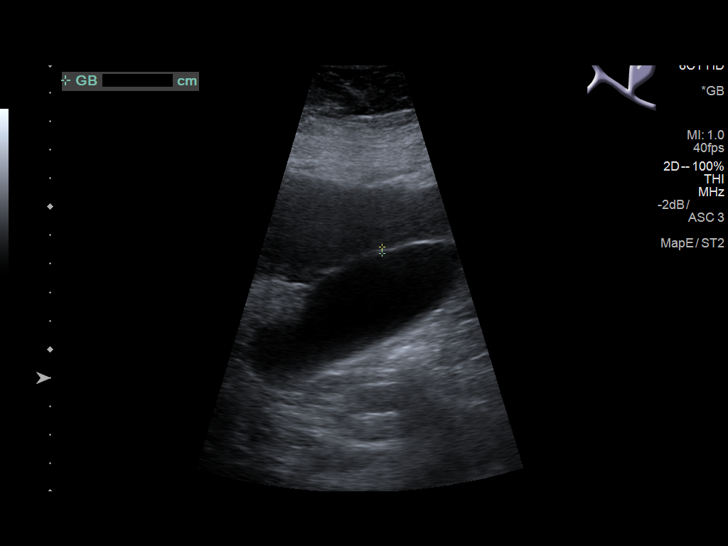
[im 8/44]
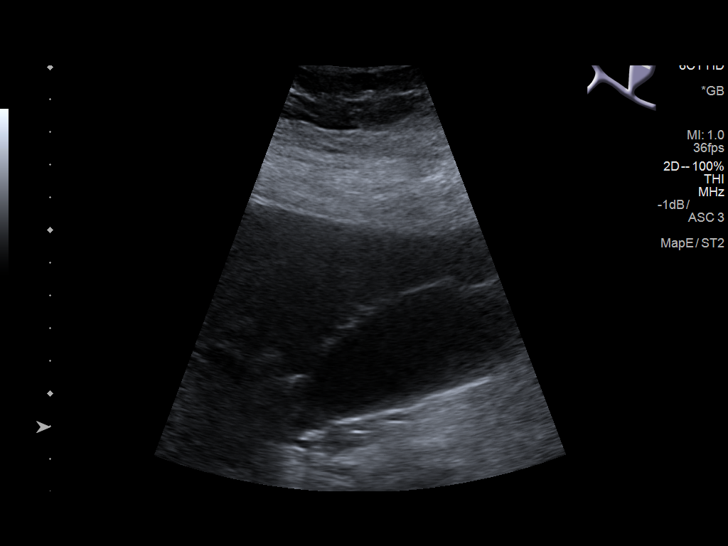
[im 11/44]
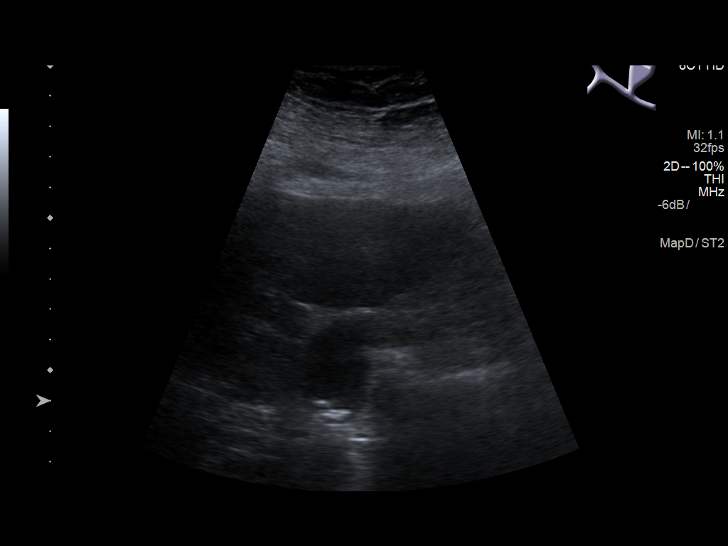
[im 15/44]
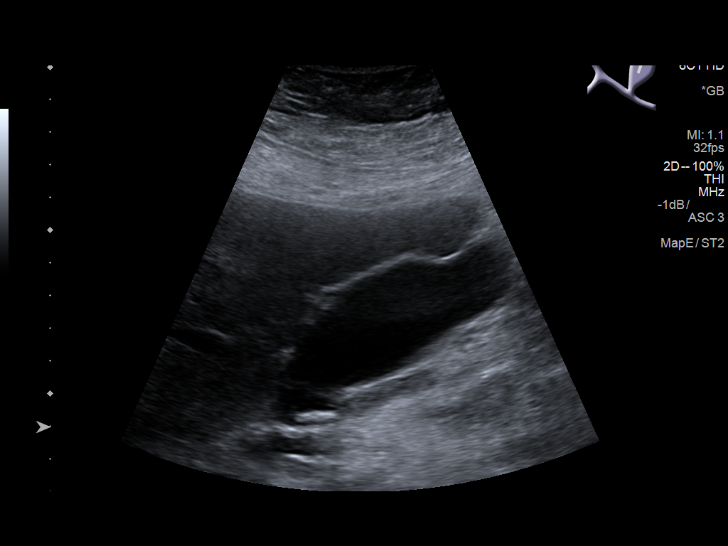
[im 17/44]
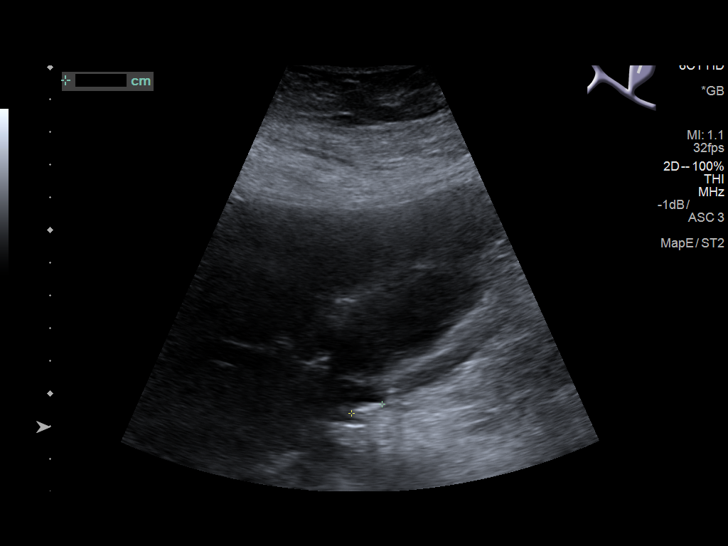
[im 20/44]
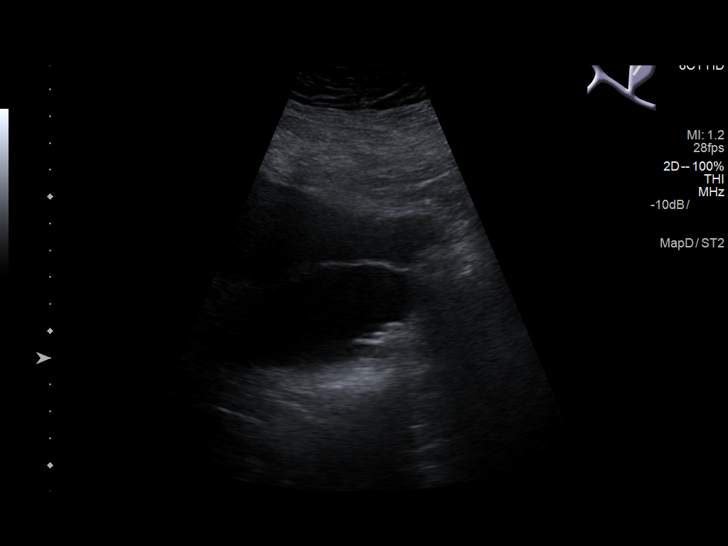
[im 24/44]
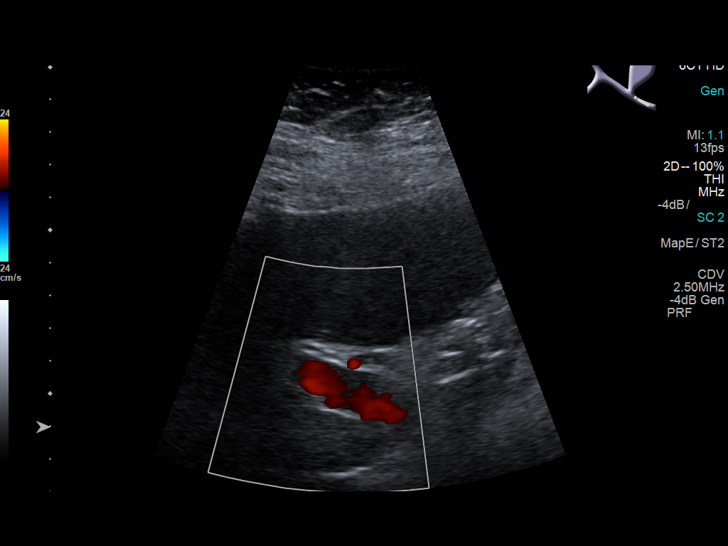
[im 27/44]
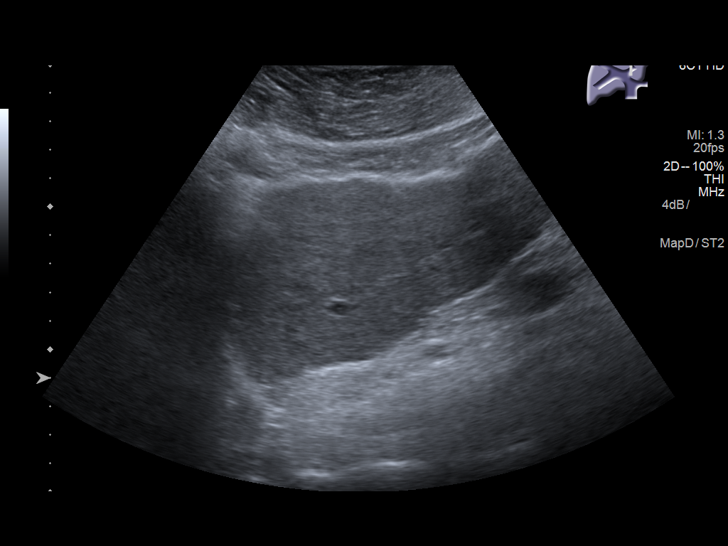
[im 29/44]
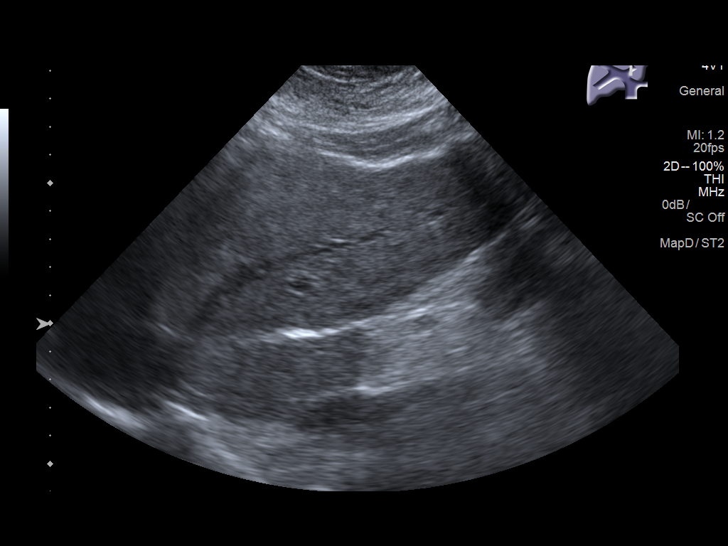
[im 33/44]
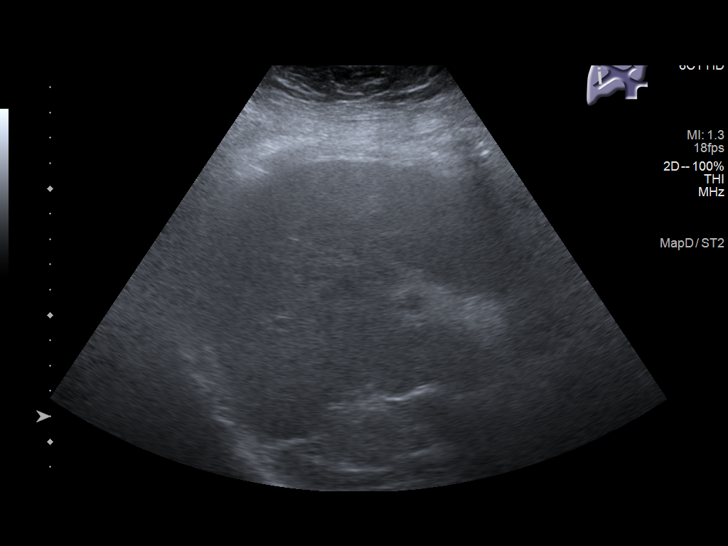
[im 36/44]
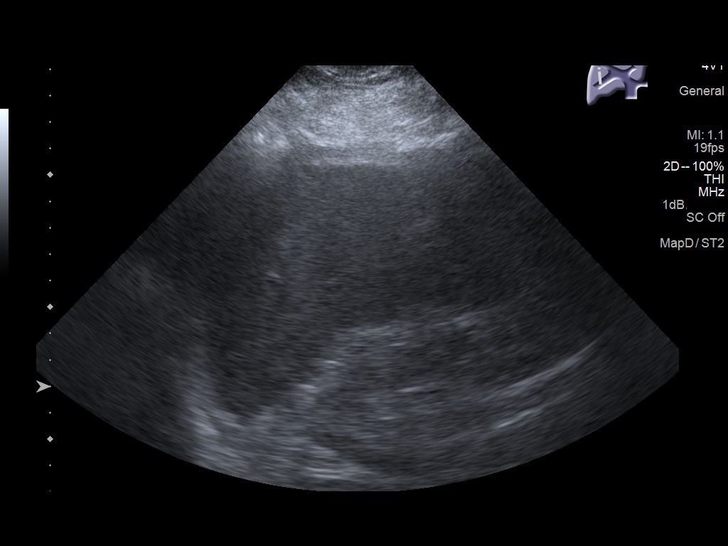
[im 40/44]
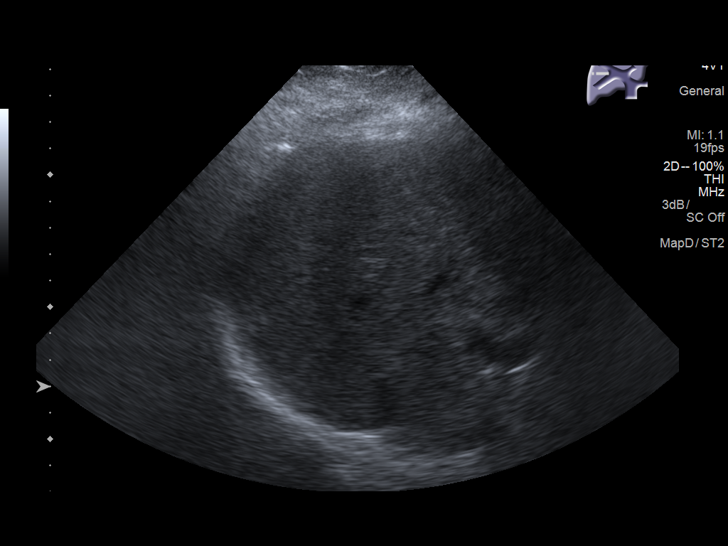
[im 44/44]
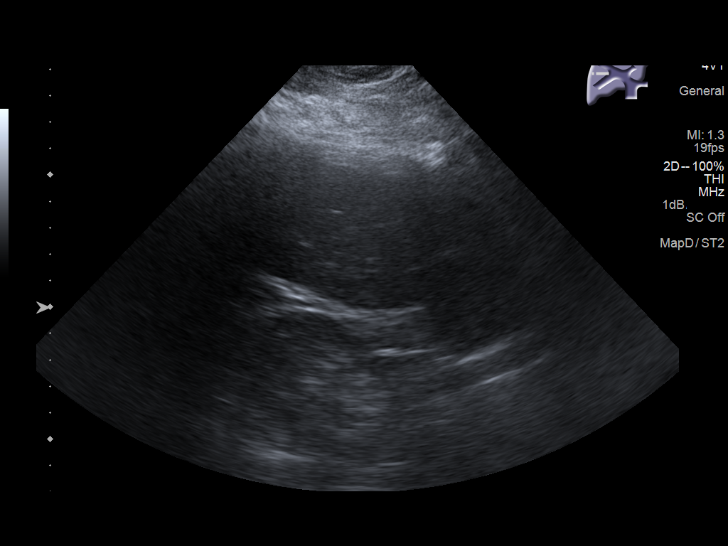

[14 of 25 positions shown; findings below may reference images not displayed]

FINDINGS: Gallbladder:

Within the gallbladder, there are multiple echogenic foci which move
and shadow consistent with cholelithiasis. Largest gallstone
measures 1.0 cm in length. No gallbladder wall thickening or
pericholecystic fluid. No sonographic Murphy sign noted by
sonographer.

Common bile duct:

Diameter: 4 mm. There is no intrahepatic or extrahepatic biliary
duct dilatation.

Liver:

No focal lesion identified. Liver echogenicity is increased
diffusely.
IMPRESSION: Cholelithiasis.

Increased liver echogenicity, a finding most likely indicative of
hepatic steatosis. While no focal liver lesions are evident, it must
be cautioned that the sensitivity of ultrasound for detection of
focal liver lesions is diminished in this circumstance.

## 2018-12-26 DEATH — deceased

## 2019-05-13 NOTE — Progress Notes (Deleted)
05/14/2019 1:24 PM   Abigail Horne 02-Jun-1946 [id number]  Referring provider: Maryland Pink, MD [address] [facility] [place],  [place] [address]  No chief complaint on file.   HPI: Patient is a 73 year old Caucasian female with a cystocele, history of recurrent UTI's and atrophic vaginitis who presents today for a yearly recheck.  Cystocele Was last seen by Dr. Marcelline Mates in 12/2017.  She still has the pessary in place.  Removed and cleaned today.  Advised patient to follow up with Dr. Marcelline Mates. ***  History of recurrent UTI's + Enterobacter cloacae with variable resistance pattern in 04/2017 + Klebsiella pneumoniae resistant to ampicillin in 08/2017  Risk factors for UTI's: age, incontinence, vaginal atrophy and constipation   Patient denies any gross hematuria, dysuria or suprapubic/flank pain.  Patient denies any fevers, chills, nausea or vomiting.   Atrophic vaginitis She is using her vaginal estrogen cream twice weekly.  ***  Mixed incontinence Currently on Detrol LA 4 mg daily.  The patient has been experiencing urgency x 4-7, frequency x 4-7, not restricting fluids to avoid visits to the restroom, is engaging in toilet mapping, incontinence x 4-7 and nocturia x 0-3.  BP is 162/68.  PVR is 11 mL.  She is complaining of frequency, nocturia, incontinence and intermittency.  She states she is soaking her pad at night.    PMH: Past Medical History:  Diagnosis Date   Anxiety    Arthritis    Atrophic vaginitis    BIH (benign intracranial hypertension)    Bladder calculus    Bladder prolapse, female, acquired    Cervix abnormality    Chronic cystitis    Cirrhosis (HCC)    Cystocele    GERD (gastroesophageal reflux disease)    Hx-TIA (transient ischemic attack)    Hyperlipidemia    Hypertension    Liver disease    Nocturia    Over weight    PND (post-nasal drip)    Seasonal allergies    Stress incontinence    Stroke [facility])     Trigonitis    Urethral caruncle    Urethral stricture    Urinary urgency     Surgical History: Past Surgical History:  Procedure Laterality Date   APPENDECTOMY  1955   arthroscopic knee surgery Left 12/2014   meniscus   BLADDER SURGERY  1999   tack   CATARACT EXTRACTION W/PHACO Left 05/19/2018   Procedure: CATARACT EXTRACTION PHACO AND INTRAOCULAR LENS PLACEMENT ([place]);  Surgeon: Birder Robson, MD;  Location: [facility] ORS;  Service: Ophthalmology;  Laterality: Left;  [place] 00:39.7 AP% 13.9 CDE 5.49 Fluid pack lot # [id number]   ESOPHAGOGASTRODUODENOSCOPY (EGD) WITH PROPOFOL N/A 09/10/2017   Procedure: ESOPHAGOGASTRODUODENOSCOPY (EGD) WITH PROPOFOL;  Surgeon: Toledo, Benay Pike, MD;  Location: [facility];  Service: Gastroenterology;  Laterality: N/A;   GASTRIC BYPASS     lap band   JOINT REPLACEMENT Left 04/2017   TONSILLECTOMY     TOTAL KNEE ARTHROPLASTY Left 07/23/2016   Procedure: TOTAL KNEE ARTHROPLASTY;  Surgeon: Hessie Knows, MD;  Location: [facility] ORS;  Service: Orthopedics;  Laterality: Left;   VAGINAL HYSTERECTOMY  1976    Home Medications:  Allergies as of 05/14/2019      Reactions   Contrast Media [iodinated Diagnostic Agents] Anaphylaxis, Other (See Comments)   irregular heartbeat. irregular heartbeat. BETADINE OK      Medication List       Accurate as of May 13, 2019  1:24 PM. If you  have any questions, ask your nurse or doctor.        augmented betamethasone dipropionate 0.05 % ointment Commonly known as: DIPROLENE-AF Apply topically to affected area daily as needed for inflammation   estradiol 0.1 MG/GM vaginal cream Commonly known as: ESTRACE Apply a pea-sized amount (0.5 mg) with fingertips to just inside the vaginal enteritis on Monday, Wednesday and Friday nights   lisinopril 20 MG tablet Commonly known as: ZESTRIL Take 20 mg by mouth daily.   metoprolol succinate 50 MG 24 hr tablet Commonly known as: TOPROL-XL Take 50 mg by mouth  daily. Take with or immediately following a meal.   MULTIVITAMIN ADULT PO Take 1 tablet by mouth daily.   mupirocin ointment 2 % Commonly known as: BACTROBAN Apply 1 application topically daily as needed (infection).   naproxen sodium 220 MG tablet Commonly known as: ALEVE Take 440 mg by mouth daily as needed (pain).   omeprazole 40 MG capsule Commonly known as: PRILOSEC Take 40 mg by mouth daily as needed (heartburn).   solifenacin 10 MG tablet Commonly known as: VESICARE Take 1 tablet (10 mg total) by mouth daily.   vitamin B-12 1000 MCG tablet Commonly known as: CYANOCOBALAMIN Take 1,000 mcg by mouth daily.   Vitamin D-1000 Max St 25 MCG (1000 UT) tablet Generic drug: Cholecalciferol Take 1,000 Units by mouth daily.       Allergies:  Allergies  Allergen Reactions   Contrast Media [Iodinated Diagnostic Agents] Anaphylaxis and Other (See Comments)    irregular heartbeat. irregular heartbeat.  BETADINE OK    Family History: Family History  Problem Relation Age of Onset   Heart attack Father    Heart disease Father    Alzheimer's disease Mother    Diabetes Mother    Diabetes Brother    Kidney disease Neg Hx    Bladder Cancer Neg Hx    Cancer Neg Hx    Breast cancer Neg Hx     Social History:  reports that she has never smoked. She has never used smokeless tobacco. She reports that she does not drink alcohol or use drugs.  ROS:                                        Physical Exam: There were no vitals taken for this visit.  Constitutional:  Well nourished. Alert and oriented, No acute distress. HEENT: [place] AT, moist mucus membranes.  Trachea midline, no masses. Cardiovascular: No clubbing, cyanosis, or edema. Respiratory: Normal respiratory effort, no increased work of breathing. GI: Abdomen is soft, non tender, non distended, no abdominal masses. Liver and spleen not palpable.  No hernias appreciated.  Stool sample for  occult testing is not indicated.   GU: No CVA tenderness.  No bladder fullness or masses.  *** external genitalia, *** pubic hair distribution, no lesions.  Normal urethral meatus, no lesions, no prolapse, no discharge.   No urethral masses, tenderness and/or tenderness. No bladder fullness, tenderness or masses. *** vagina mucosa, *** estrogen effect, no discharge, no lesions, *** pelvic support, *** cystocele and *** rectocele noted.  No cervical motion tenderness.  Uterus is freely mobile and non-fixed.  No adnexal/parametria masses or tenderness noted.  Anus and perineum are without rashes or lesions.   ***  Skin: No rashes, bruises or suspicious lesions. Lymph: No cervical or inguinal adenopathy. Neurologic: Grossly intact, no focal deficits, moving all  4 extremities. Psychiatric: Normal mood and affect.   Laboratory Data: Pertinent Imaging: ***    Assessment & Plan:    1. Cystocele:   Managed with a pessary through Dr. Andreas Blower office.  Not had it removed or cleaned since 12/2017.  Removed and cleaned today.  Advised the patient to follow-up with Dr. Marcelline Mates.    2. History of recurrent UTI Explained to patient that per ID guidelines her urine should not be checked unless she is having actual UTI symptoms that we do not screen for asymptomatic bacteriuria - will not need an UA  Asked patient to contact office for UTI symptoms -explained that the UTI symptoms consist of dysuria, gross hematuria fevers or chills.  3. Atrophic vaginitis:   She is using the cream as prescribed.  I have sent a prescription to her pharmacy.      4. Mixed Incontinence Continue Detrol LA 4 mg daily; refill given  RTC in one year for OAB questionnaire and PVR   5. Nocturia  - I explained to the patient that nocturia is often multi-factorial and difficult to treat.  Sleeping disorders, heart conditions, peripheral vascular disease, diabetes, an enlarged prostate for men, an urethral stricture causing bladder  outlet obstruction and/or certain medications can contribute to nocturia.  - I have suggested that the patient avoid caffeine after noon and alcohol in the evening.  He or she may also benefit from fluid restrictions after 6:00 in the evening and voiding just prior to bedtime.  - I have explained that research studies have showed that over 84% of patients with sleep apnea reported frequent nighttime urination.   With sleep apnea, oxygen decreases, carbon dioxide increases, the blood become more acidic, the heart rate drops and blood vessels in the lung constrict.  The body is then alerted that something is very wrong. The sleeper must wake enough to reopen the airway. By this time, the heart is racing and experiences a false signal of fluid overload. The heart excretes a hormone-like protein that tells the body to get rid of sodium and water, resulting in nocturia.  -  I also informed the patient that a recent study noted that decreasing sodium intake to 2.3 grams daily, if they don't have issues with hyponatremia, can also reduce the number of nightly voids  - There is also an increased incidence in sleep apnea with menopause, symptoms include night sweats, daytime sleepiness, depressed mood, and cognitive complaints like poor concentration or problems with short-term memory   - The patient may benefit from a discussion with his or her primary care physician to see if he or she has risk factors for sleep apnea or other sleep disturbances and obtaining a sleep study.   No follow-ups on file.  These notes generated with voice recognition software. I apologize for typographical errors.  Zara Council, PA-C  [facility] [address] [place], [place] [address] [phone]

## 2019-05-14 ENCOUNTER — Ambulatory Visit: Payer: Medicare HMO | Admitting: Urology

## 2019-05-17 ENCOUNTER — Telehealth: Payer: Self-pay | Admitting: Urology

## 2019-05-17 NOTE — Telephone Encounter (Signed)
Please note in the chart that Mrs. Staller is deceased.  She passed away on Dec 28, 2018.
